# Patient Record
Sex: Male | Born: 1965 | Race: Black or African American | Hispanic: No | Marital: Married | Smoking: Former smoker
Health system: Southern US, Community
[De-identification: ages and names within clinical notes are randomized; demographics above are authoritative.]

## PROBLEM LIST (undated history)

## (undated) DIAGNOSIS — T7840XA Allergy, unspecified, initial encounter: Secondary | ICD-10-CM

## (undated) DIAGNOSIS — K219 Gastro-esophageal reflux disease without esophagitis: Secondary | ICD-10-CM

## (undated) DIAGNOSIS — M1990 Unspecified osteoarthritis, unspecified site: Secondary | ICD-10-CM

## (undated) DIAGNOSIS — Z8719 Personal history of other diseases of the digestive system: Secondary | ICD-10-CM

## (undated) DIAGNOSIS — I4819 Other persistent atrial fibrillation: Secondary | ICD-10-CM

## (undated) DIAGNOSIS — I499 Cardiac arrhythmia, unspecified: Secondary | ICD-10-CM

## (undated) DIAGNOSIS — E785 Hyperlipidemia, unspecified: Secondary | ICD-10-CM

## (undated) DIAGNOSIS — T4145XA Adverse effect of unspecified anesthetic, initial encounter: Secondary | ICD-10-CM

## (undated) DIAGNOSIS — G4733 Obstructive sleep apnea (adult) (pediatric): Secondary | ICD-10-CM

## (undated) DIAGNOSIS — I480 Paroxysmal atrial fibrillation: Secondary | ICD-10-CM

## (undated) DIAGNOSIS — I4891 Unspecified atrial fibrillation: Secondary | ICD-10-CM

## (undated) HISTORY — DX: Unspecified atrial fibrillation: I48.91

## (undated) HISTORY — DX: Other persistent atrial fibrillation: I48.19

## (undated) HISTORY — DX: Hyperlipidemia, unspecified: E78.5

## (undated) HISTORY — DX: Unspecified osteoarthritis, unspecified site: M19.90

## (undated) HISTORY — DX: Obstructive sleep apnea (adult) (pediatric): G47.33

## (undated) HISTORY — PX: ANTERIOR CRUCIATE LIGAMENT REPAIR: SHX115

## (undated) HISTORY — DX: Gastro-esophageal reflux disease without esophagitis: K21.9

## (undated) HISTORY — DX: Allergy, unspecified, initial encounter: T78.40XA

## (undated) HISTORY — DX: Paroxysmal atrial fibrillation: I48.0

## (undated) HISTORY — DX: Personal history of other diseases of the digestive system: Z87.19

---

## 1998-09-10 ENCOUNTER — Ambulatory Visit (HOSPITAL_COMMUNITY): Admission: RE | Admit: 1998-09-10 | Discharge: 1998-09-10 | Payer: Self-pay | Admitting: *Deleted

## 2006-03-14 ENCOUNTER — Ambulatory Visit: Payer: Self-pay | Admitting: Family Medicine

## 2006-03-16 ENCOUNTER — Ambulatory Visit: Payer: Self-pay | Admitting: Family Medicine

## 2006-06-20 ENCOUNTER — Ambulatory Visit: Payer: Self-pay | Admitting: Family Medicine

## 2006-11-08 ENCOUNTER — Ambulatory Visit: Payer: Self-pay | Admitting: Family Medicine

## 2007-03-08 ENCOUNTER — Ambulatory Visit: Payer: Self-pay | Admitting: Family Medicine

## 2007-09-20 ENCOUNTER — Ambulatory Visit: Payer: Self-pay | Admitting: Family Medicine

## 2008-02-22 ENCOUNTER — Observation Stay (HOSPITAL_COMMUNITY): Admission: EM | Admit: 2008-02-22 | Discharge: 2008-02-23 | Payer: Self-pay | Admitting: Emergency Medicine

## 2008-02-22 ENCOUNTER — Encounter (INDEPENDENT_AMBULATORY_CARE_PROVIDER_SITE_OTHER): Payer: Self-pay | Admitting: Internal Medicine

## 2008-02-22 ENCOUNTER — Ambulatory Visit: Payer: Self-pay | Admitting: Cardiology

## 2008-03-16 ENCOUNTER — Ambulatory Visit: Payer: Self-pay | Admitting: Family Medicine

## 2008-04-10 HISTORY — PX: TOTAL KNEE ARTHROPLASTY: SHX125

## 2008-04-10 HISTORY — PX: CHOLECYSTECTOMY: SHX55

## 2008-05-15 ENCOUNTER — Ambulatory Visit: Payer: Self-pay | Admitting: Cardiology

## 2008-05-22 ENCOUNTER — Ambulatory Visit: Payer: Self-pay

## 2008-05-22 ENCOUNTER — Encounter: Payer: Self-pay | Admitting: Cardiology

## 2008-06-01 ENCOUNTER — Ambulatory Visit: Payer: Self-pay | Admitting: Cardiology

## 2008-06-09 DIAGNOSIS — I4891 Unspecified atrial fibrillation: Secondary | ICD-10-CM | POA: Insufficient documentation

## 2008-07-20 ENCOUNTER — Ambulatory Visit: Payer: Self-pay | Admitting: Family Medicine

## 2008-10-30 ENCOUNTER — Encounter (INDEPENDENT_AMBULATORY_CARE_PROVIDER_SITE_OTHER): Payer: Self-pay | Admitting: *Deleted

## 2008-12-11 ENCOUNTER — Encounter (INDEPENDENT_AMBULATORY_CARE_PROVIDER_SITE_OTHER): Payer: Self-pay | Admitting: *Deleted

## 2008-12-16 ENCOUNTER — Ambulatory Visit: Payer: Self-pay | Admitting: Cardiology

## 2009-03-16 ENCOUNTER — Ambulatory Visit: Payer: Self-pay | Admitting: Family Medicine

## 2009-03-25 ENCOUNTER — Inpatient Hospital Stay (HOSPITAL_COMMUNITY): Admission: RE | Admit: 2009-03-25 | Discharge: 2009-03-28 | Payer: Self-pay | Admitting: Orthopedic Surgery

## 2009-12-28 IMAGING — CR DG CHEST 2V
2 series · 2 of 2 positions shown · non-contrast
Comparison: None.

CLINICAL DATA: Chest pain.

CHEST - 2 VIEW

[w chest pa]
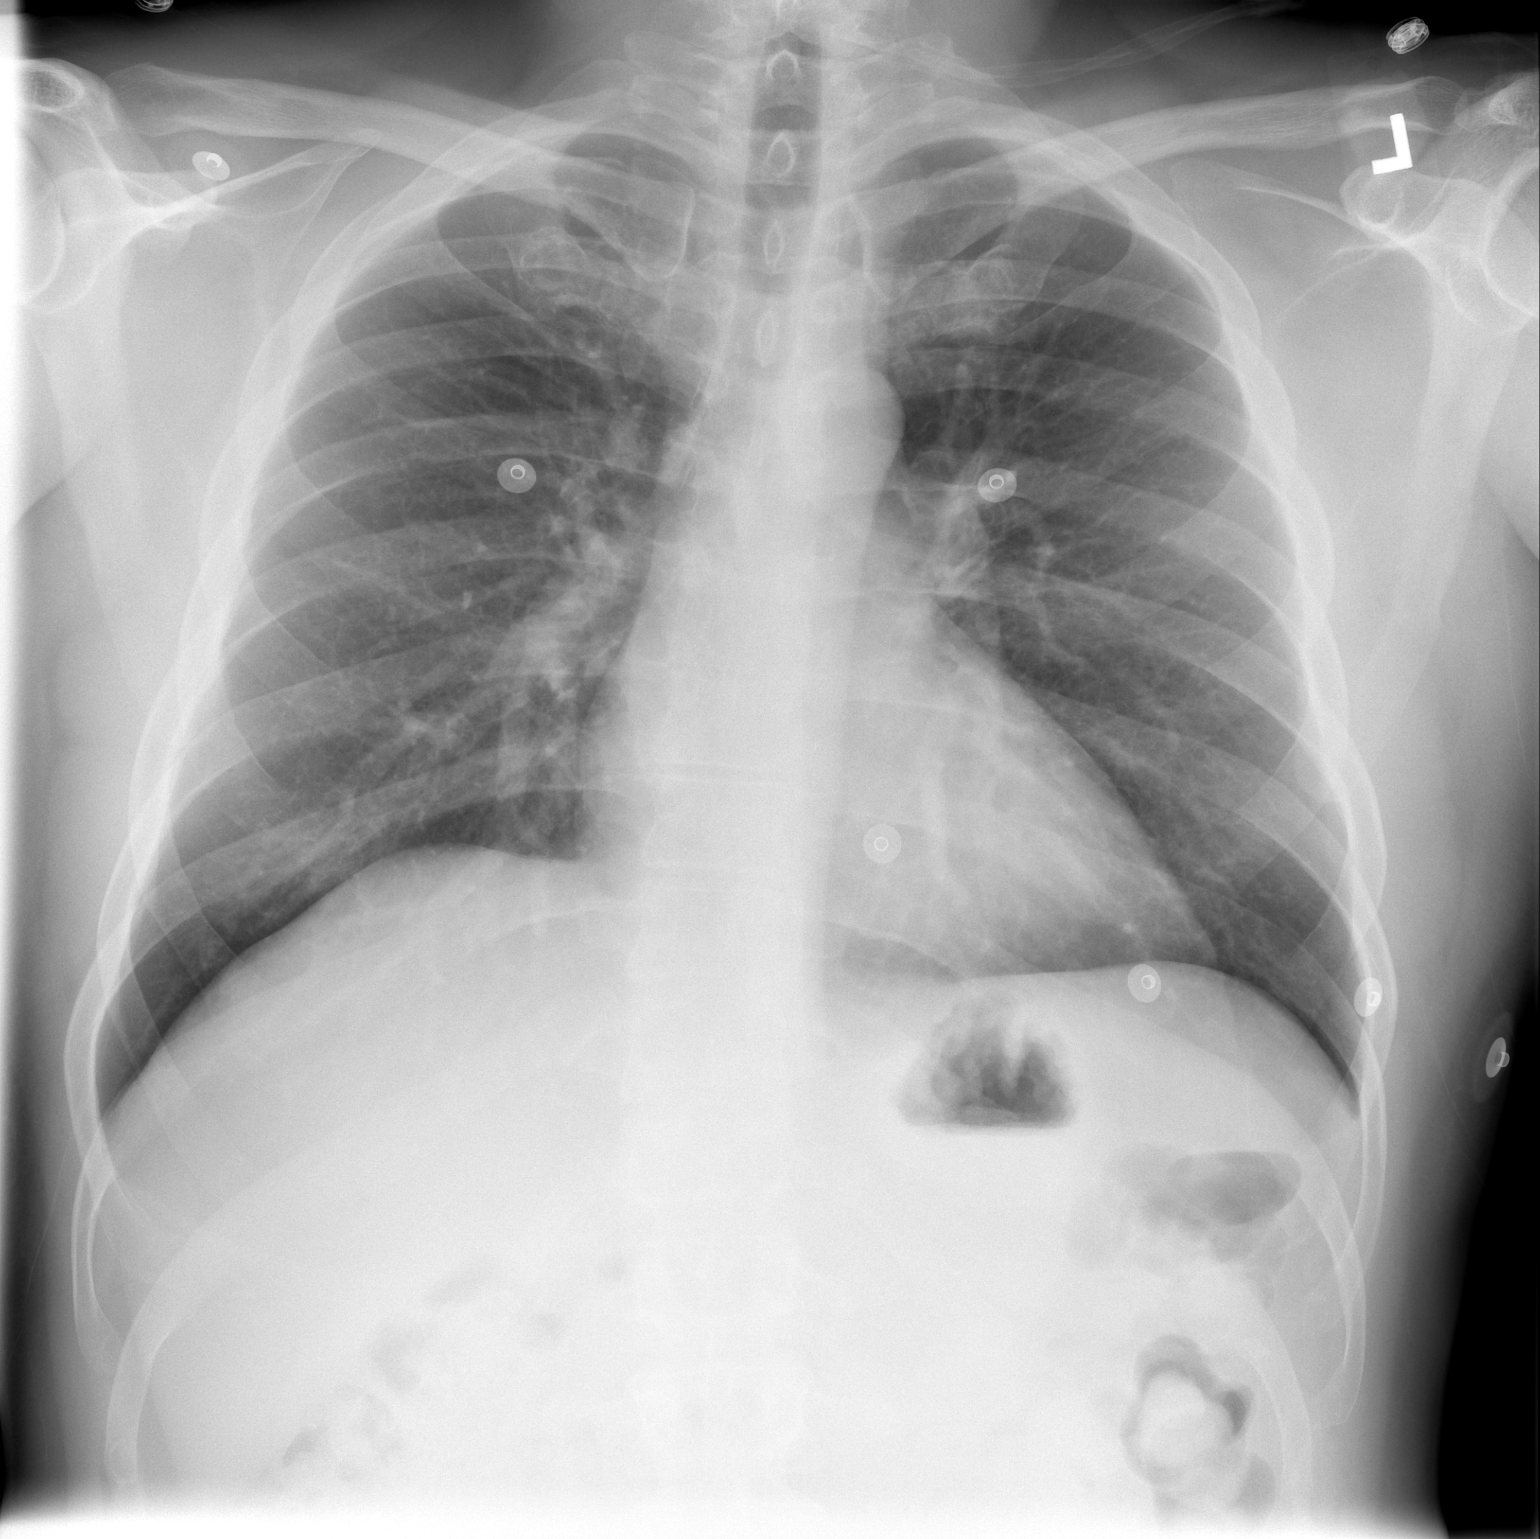

[w chest lat]
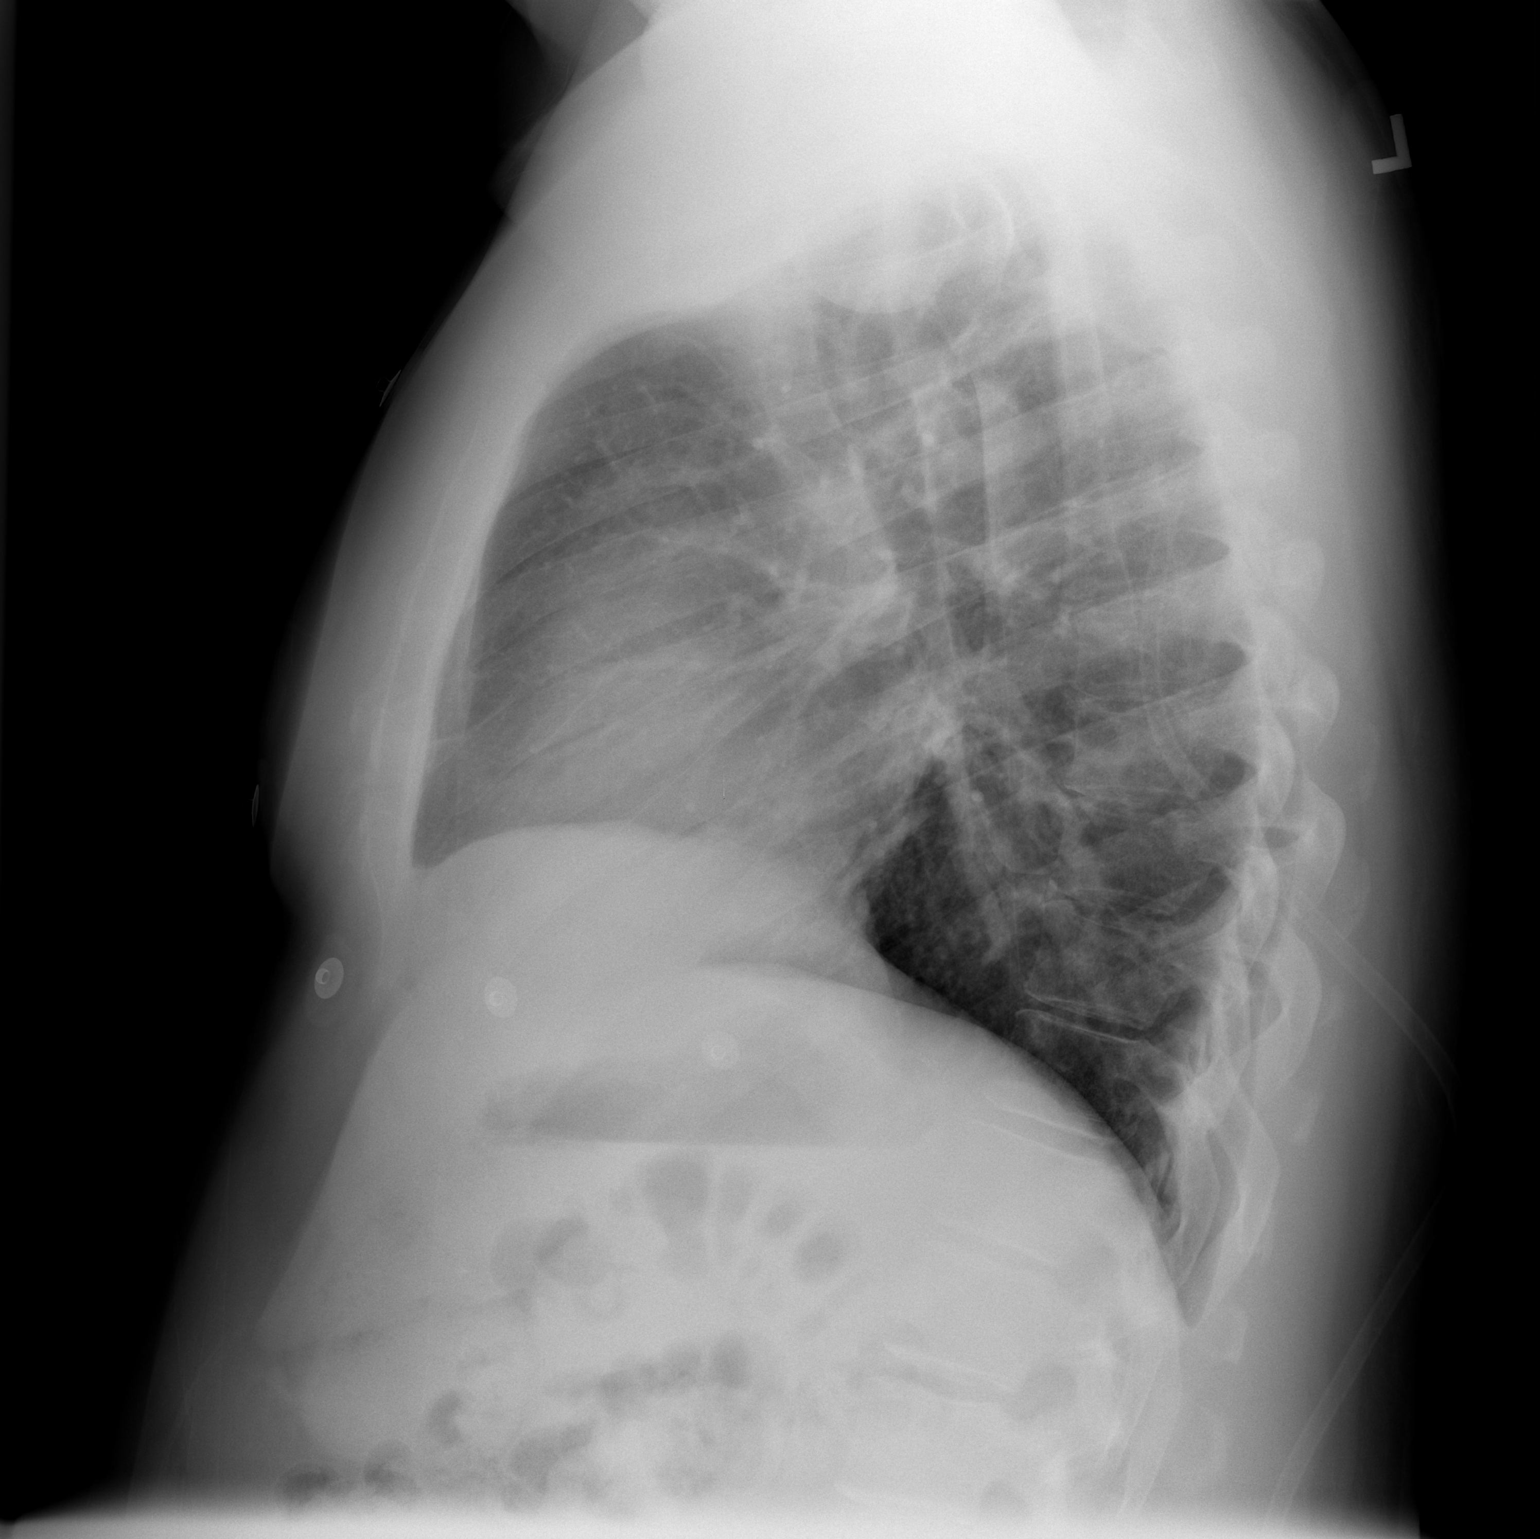

[2 of 2 positions shown; findings below may reference images not displayed]

FINDINGS: The lungs are clear without focal consolidation, edema,
effusion or pneumothorax.  Cardiopericardial silhouette is within
normal limits for size.  Imaged bony structures of the thorax are
intact.
IMPRESSION: No acute cardiopulmonary process.

## 2009-12-28 IMAGING — US US ABDOMEN COMPLETE
1 series · 14 of 25 positions shown · non-contrast
Comparison: None.

CLINICAL DATA: Chest pain.

ABDOMEN ULTRASOUND
TECHNIQUE: Complete abdominal ultrasound examination was performed
including evaluation of the liver, gallbladder, bile ducts,
pancreas, kidneys, spleen, IVC, and abdominal aorta.

[Series 1: unknown · 0.41mm/px · 14 of 69 slices shown]
[im 1/69]
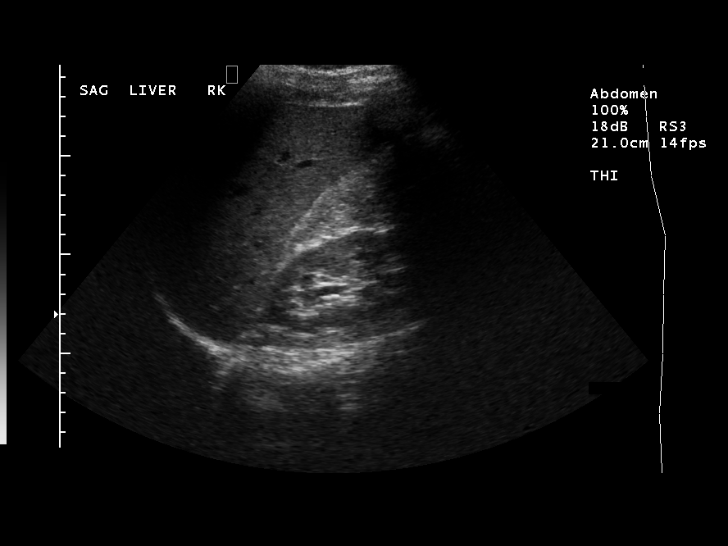
[im 6/69]
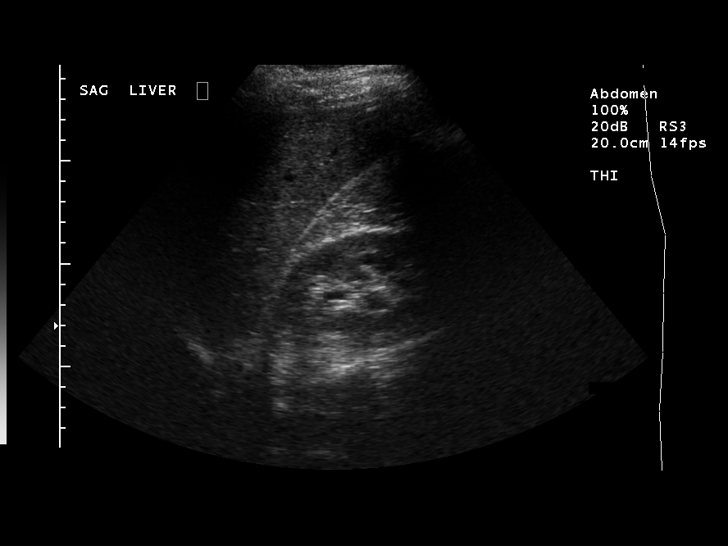
[im 12/69]
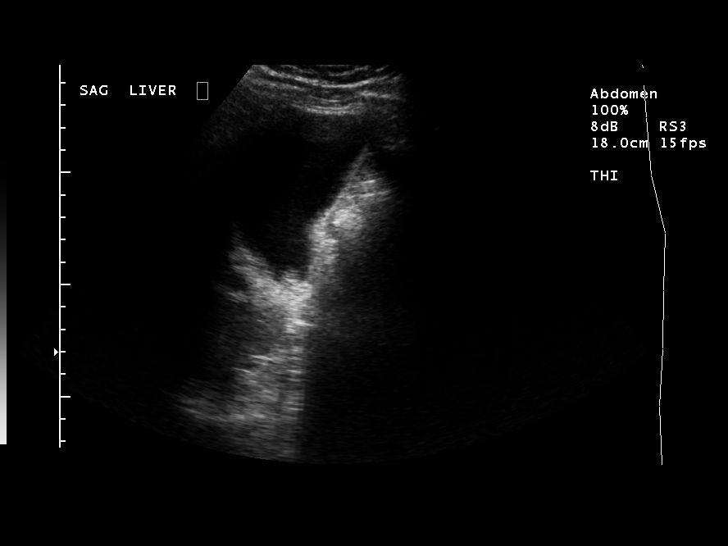
[im 18/69]
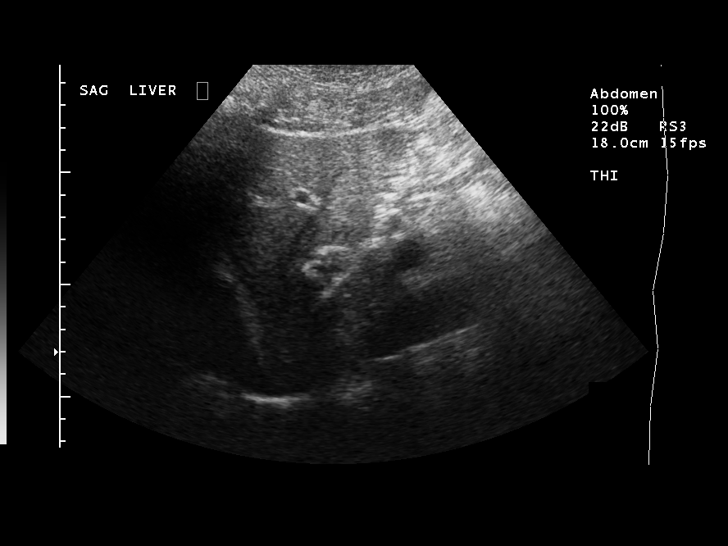
[im 23/69]
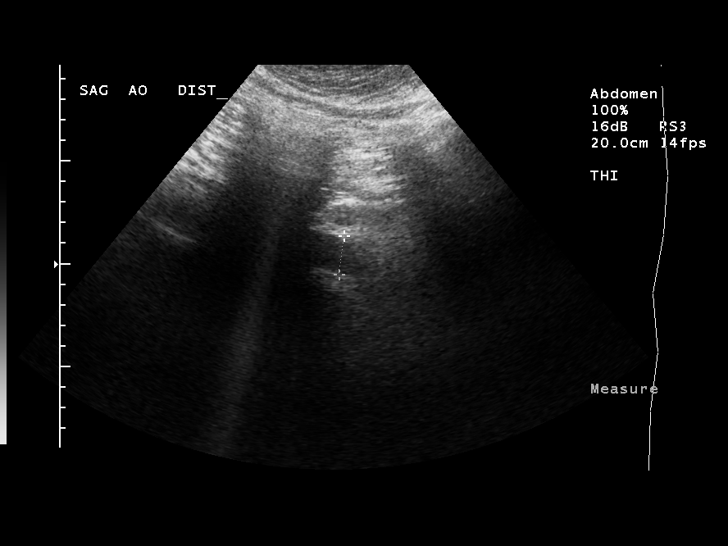
[im 26/69]
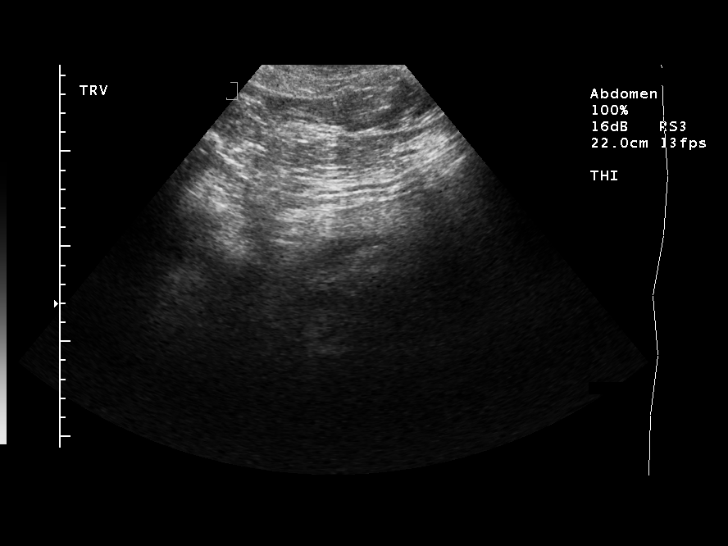
[im 32/69]
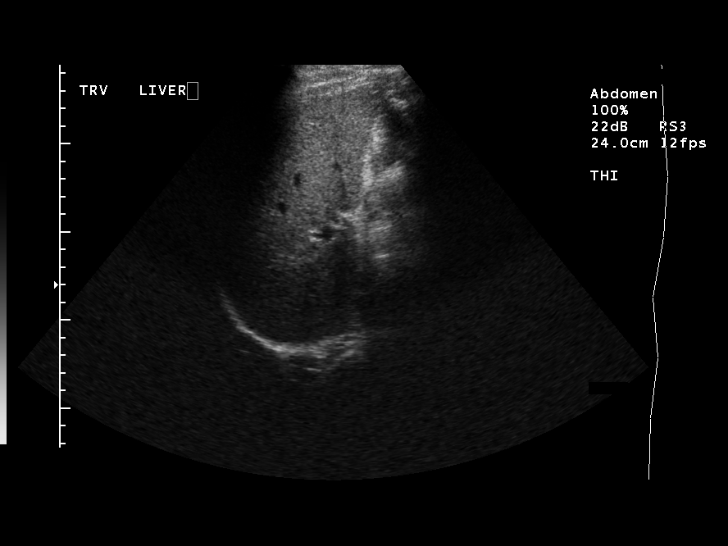
[im 37/69]
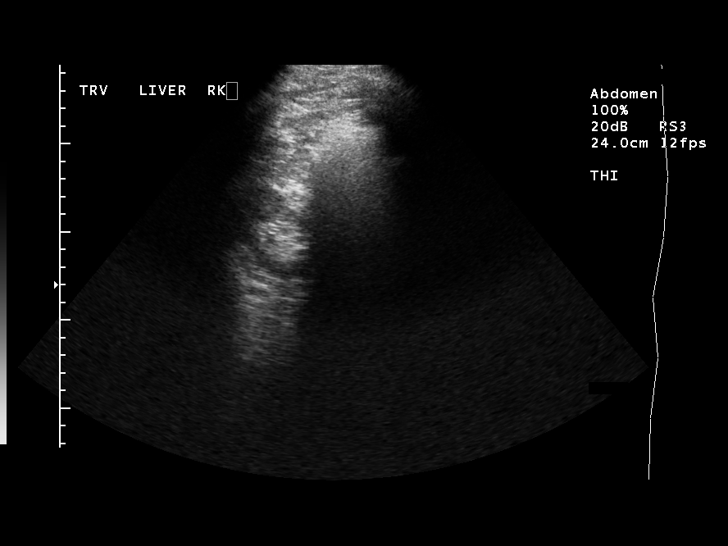
[im 43/69]
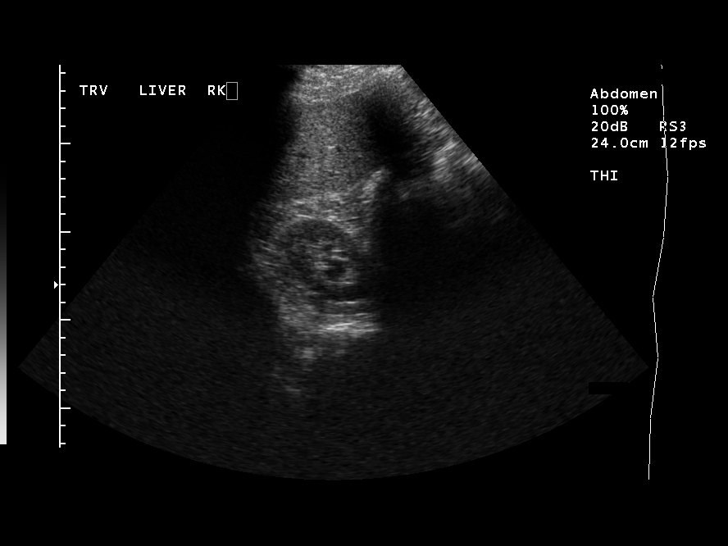
[im 46/69]
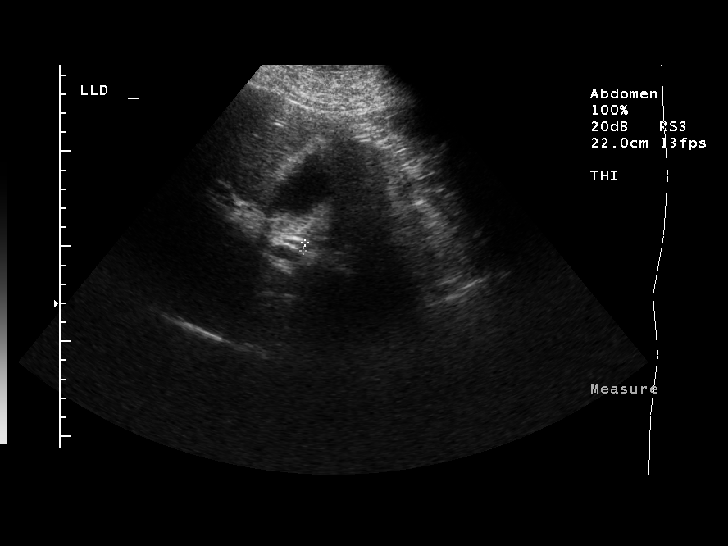
[im 52/69]
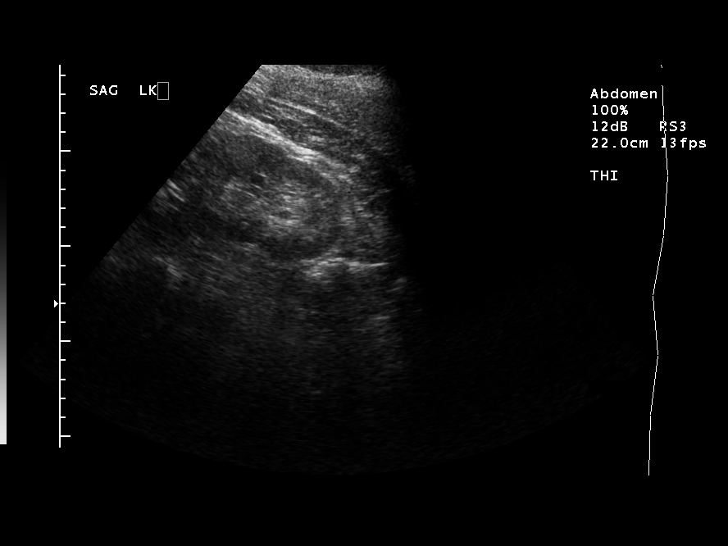
[im 57/69]
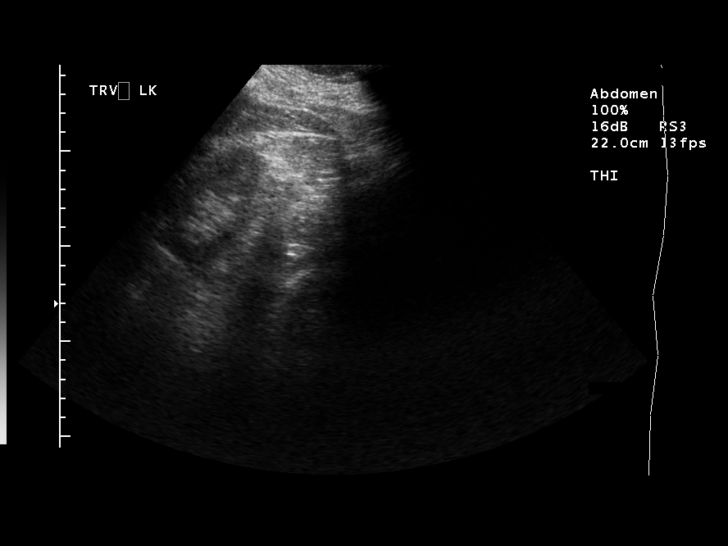
[im 63/69]
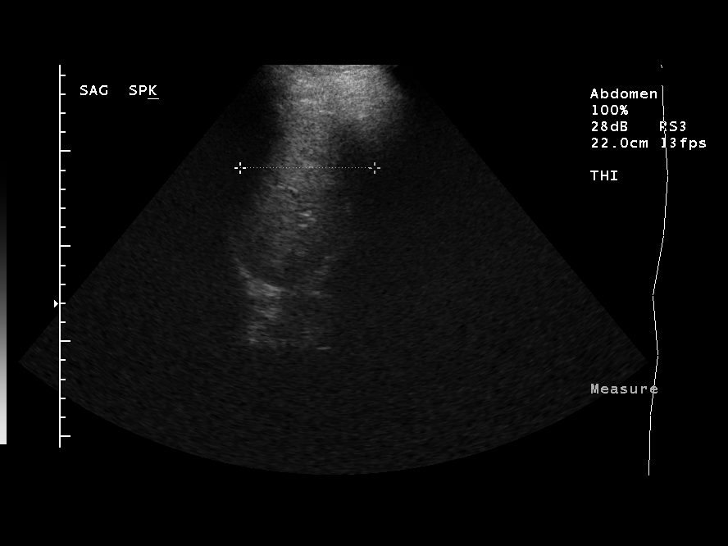
[im 69/69]
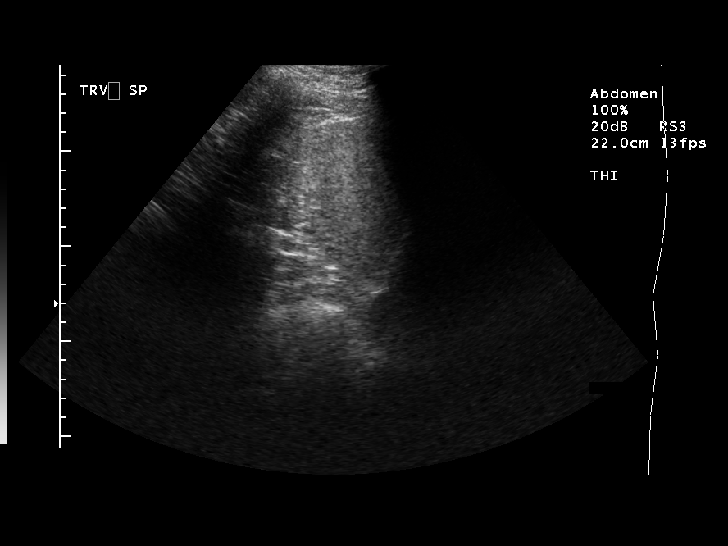

[14 of 25 positions shown; findings below may reference images not displayed]

FINDINGS: Multiple small gallstones in the gallbladder.  The
largest measures 9 mm in maximum diameter.  No gallbladder wall
thickening or pericholecystic fluid.  No tenderness over the
gallbladder.  No biliary ductal dilatation.  The proximal common
duct measures 4.4 mm in maximum diameter.  Poorly visualized
pancreas, abdominal aorta and inferior vena cava due to overlying
bowel gas.  The visualized portions appear normal.  Unremarkable
spleen, kidneys and liver.
IMPRESSION: 1.  Cholelithiasis without evidence of cholecystitis.
2.  Limited examination due to overlying bowel gas.

## 2010-02-09 ENCOUNTER — Ambulatory Visit: Payer: Self-pay | Admitting: Family Medicine

## 2010-05-10 NOTE — Letter (Signed)
Summary: Appointment - Reminder 2  [facility], Main Office  [address]   [place], [place] [address]   Phone: [phone]  Fax: [phone]     October 30, 2008 MRN: [id number]   Ying Ney [address] [place], [place]  [address]   Dear Mr. Milholland,  Our records indicate that it is time to schedule a follow-up appointment.  Dr.Wall recommended that you follow up with [place] in August 2010. It is very important that we reach you to schedule this appointment. We look forward to participating in your health care needs. Please contact us at the number listed above at your earliest convenience to schedule your appointment.  If you are unable to make an appointment at this time, give [place] a call so we can update our records.     Sincerely,  Lorne Skeens [facility] Scheduling Team

## 2010-05-10 NOTE — Miscellaneous (Signed)
Summary: med update  Clinical Lists Changes  Medications: Added new medication of SIMVASTATIN 40 MG TABS (SIMVASTATIN) 1 tab at bedtime Added new medication of ACIPHEX 20 MG TBEC (RABEPRAZOLE SODIUM) 1 tab once daily Added new medication of ASPIRIN EC 325 MG TBEC (ASPIRIN) Take one tablet by mouth daily Added new medication of MULTIVITAMINS   TABS (MULTIPLE VITAMIN) 1 tab once daily Added new medication of DILTIAZEM HCL ER BEADS 240 MG XR24H-CAP (DILTIAZEM HCL ER BEADS) 1 cap once daily

## 2010-05-10 NOTE — Assessment & Plan Note (Signed)
Summary: 6 MO F/U  Medications Added SIMVASTATIN 40 MG TABS (SIMVASTATIN) 1 tab at bedtime DILTIAZEM HCL ER BEADS 240 MG XR24H-CAP (DILTIAZEM HCL ER BEADS) 1 cap once daily      Allergies Added: NKDA  Visit Type:  6 mo f/u Primary Provider:  Dr. Susann Givens  CC:  no cardiac complaints.  History of Present Illness: Mr. Alan Thompson returns today for paroxysmal atrial fibrillation which is felt to be idiopathic.  He's had no breakthrough since I last saw him. He has gained weight and says he needs to get back into a regular exercise routine today. We reviewed this and the guidelines at length.  Current Medications (verified): 1)  Simvastatin 40 Mg Tabs (Simvastatin) .Marland Kitchen.. 1 Tab At Bedtime 2)  Aciphex 20 Mg Tbec (Rabeprazole Sodium) .Marland Kitchen.. 1 Tab Once Daily 3)  Aspirin Ec 325 Mg Tbec (Aspirin) .... Take One Tablet By Mouth Daily 4)  Multivitamins   Tabs (Multiple Vitamin) .Marland Kitchen.. 1 Tab Once Daily 5)  Diltiazem Hcl Er Beads 240 Mg Xr24h-Cap (Diltiazem Hcl Er Beads) .Marland Kitchen.. 1 Cap Once Daily  Allergies (verified): No Known Drug Allergies  Past History:  Past Medical History: Last updated: 06/09/2008 Atrial Fibrillation Hyperlipidemia G E R D  Past Surgical History: Last updated: 06/09/2008 Unremarkable  Family History: Last updated: 06/09/2008 Negative FH of Diabetes, Hypertension, or Coronary Artery Disease  Social History: Last updated: 06/09/2008 Full Time Married  Tobacco Use - Former.  Alcohol Use - no Regular Exercise - no Drug Use - no  Risk Factors: Exercise: no (06/09/2008)  Risk Factors: Smoking Status: quit (06/09/2008)  Review of Systems       negative other than history of present illness  Vital Signs:  Patient profile:   45 year old male Height:      70 inches Weight:      230 pounds BMI:     33.12 Pulse rate:   58 / minute Pulse rhythm:   regular BP sitting:   110 / 80  (left arm) Cuff size:   large  Vitals Entered By: Danielle [place], CMA (December 16, 2008 3:11 PM)  Physical Exam  General:  overweight Head:  normocephalic and atraumatic Eyes:  PERRLA/EOM intact; conjunctiva and lids normal. Mouth:  Teeth, gums and palate normal. Oral mucosa normal. Neck:  Neck supple, no JVD. No masses, thyromegaly or abnormal cervical nodes. Lungs:  Clear bilaterally to auscultation and percussion. Heart:  Non-displaced PMI, chest non-tender; regular rate and rhythm, S1, S2 without murmurs, rubs or gallops. Carotid upstroke normal, no bruit. Normal abdominal aortic size, no bruits. Femorals normal pulses, no bruits. Pedals normal pulses. No edema, no varicosities. Abdomen:  Bowel sounds positive; abdomen soft and non-tender without masses, organomegaly, or hernias noted. No hepatosplenomegaly. Msk:  Back normal, normal gait. Muscle strength and tone normal. Neurologic:  Alert and oriented x 3. Skin:  Intact without lesions or rashes. Psych:  Normal affect.   EKG  Procedure date:  12/16/2008  Findings:      sinus bradycardia, otherwise normal  Impression & Recommendations:  Problem # 1:  ATRIAL FIBRILLATION (ICD-427.31)  His updated medication list for this problem includes:    Aspirin Ec 325 Mg Tbec (Aspirin) .Marland Kitchen... Take one tablet by mouth daily Prescriptions: DILTIAZEM HCL ER BEADS 240 MG XR24H-CAP (DILTIAZEM HCL ER BEADS) 1 cap once daily  #90 x 3   Entered by:   Danielle [place], CMA   Authorized by:   Gaylord Shih, MD, [facility]  Signed by:   Danielle [place], CMA on 12/16/2008   Method used:   Electronically to        [facility]* (mail-order)             ,          Ph: [phone]       Fax: [phone]   RxID:   [id number] SIMVASTATIN 40 MG TABS (SIMVASTATIN) 1 tab at bedtime  #90 x 3   Entered by:   Danielle [place], CMA   Authorized by:   Gaylord Shih, MD, [facility]   Signed by:   Danielle [place], CMA on 12/16/2008   Method used:   Electronically to        [facility]* (mail-order)             ,          Ph: [phone]       Fax:  [phone]   RxID:   [id number]

## 2010-07-11 LAB — BASIC METABOLIC PANEL
BUN: 10 mg/dL (ref 6–23)
BUN: 10 mg/dL (ref 6–23)
BUN: 14 mg/dL (ref 6–23)
CO2: 27 mEq/L (ref 19–32)
CO2: 29 mEq/L (ref 19–32)
CO2: 29 mEq/L (ref 19–32)
Calcium: 8 mg/dL — ABNORMAL LOW (ref 8.4–10.5)
Calcium: 8.1 mg/dL — ABNORMAL LOW (ref 8.4–10.5)
Calcium: 8.1 mg/dL — ABNORMAL LOW (ref 8.4–10.5)
Chloride: 96 mEq/L (ref 96–112)
Chloride: 97 mEq/L (ref 96–112)
Chloride: 98 mEq/L (ref 96–112)
Creatinine, Ser: 1.18 mg/dL (ref 0.4–1.5)
Creatinine, Ser: 1.19 mg/dL (ref 0.4–1.5)
Creatinine, Ser: 1.23 mg/dL (ref 0.4–1.5)
GFR calc Af Amer: >60 mL/min (ref >60)
GFR calc Af Amer: >60 mL/min (ref >60)
GFR calc Af Amer: >60 mL/min (ref >60)
GFR calc non Af Amer: >60 mL/min (ref >60)
GFR calc non Af Amer: >60 mL/min (ref >60)
GFR calc non Af Amer: >60 mL/min (ref >60)
Glucose, Bld: 111 mg/dL — ABNORMAL HIGH (ref 70–99)
Glucose, Bld: 112 mg/dL — ABNORMAL HIGH (ref 70–99)
Glucose, Bld: 125 mg/dL — ABNORMAL HIGH (ref 70–99)
Potassium: 3.7 mEq/L (ref 3.5–5.1)
Potassium: 4.2 mEq/L (ref 3.5–5.1)
Potassium: 4.4 mEq/L (ref 3.5–5.1)
Sodium: 132 mEq/L — ABNORMAL LOW (ref 135–145)
Sodium: 132 mEq/L — ABNORMAL LOW (ref 135–145)
Sodium: 133 mEq/L — ABNORMAL LOW (ref 135–145)

## 2010-07-11 LAB — CBC
HCT: 25.5 % — ABNORMAL LOW (ref 39.0–52.0)
HCT: 27.2 % — ABNORMAL LOW (ref 39.0–52.0)
HCT: 33.7 % — ABNORMAL LOW (ref 39.0–52.0)
Hemoglobin: 11.6 g/dL — ABNORMAL LOW (ref 13.0–17.0)
Hemoglobin: 8.7 g/dL — ABNORMAL LOW (ref 13.0–17.0)
Hemoglobin: 9.6 g/dL — ABNORMAL LOW (ref 13.0–17.0)
MCHC: 34.1 g/dL (ref 30.0–36.0)
MCHC: 34.3 g/dL (ref 30.0–36.0)
MCHC: 35.2 g/dL (ref 30.0–36.0)
MCV: 93.5 fL (ref 78.0–100.0)
MCV: 94.3 fL (ref 78.0–100.0)
MCV: 94.9 fL (ref 78.0–100.0)
Platelets: 167 10*3/uL (ref 150–400)
Platelets: 183 10*3/uL (ref 150–400)
Platelets: 213 10*3/uL (ref 150–400)
RBC: 2.7 MIL/uL — ABNORMAL LOW (ref 4.22–5.81)
RBC: 2.91 MIL/uL — ABNORMAL LOW (ref 4.22–5.81)
RBC: 3.55 MIL/uL — ABNORMAL LOW (ref 4.22–5.81)
RDW: 12.3 % (ref 11.5–15.5)
RDW: 12.3 % (ref 11.5–15.5)
RDW: 12.4 % (ref 11.5–15.5)
WBC: 10.3 10*3/uL (ref 4.0–10.5)
WBC: 9.3 10*3/uL (ref 4.0–10.5)
WBC: 9.4 10*3/uL (ref 4.0–10.5)

## 2010-07-11 LAB — PROTIME-INR
INR: 1.3 (ref 0.00–1.49)
INR: 1.56 — ABNORMAL HIGH (ref 0.00–1.49)
INR: 1.95 — ABNORMAL HIGH (ref 0.00–1.49)
Prothrombin Time: 16.1 seconds — ABNORMAL HIGH (ref 11.6–15.2)
Prothrombin Time: 18.5 seconds — ABNORMAL HIGH (ref 11.6–15.2)
Prothrombin Time: 22.1 seconds — ABNORMAL HIGH (ref 11.6–15.2)

## 2010-07-12 LAB — COMPREHENSIVE METABOLIC PANEL
ALT: 30 U/L (ref 0–53)
AST: 28 U/L (ref 0–37)
Albumin: 4.2 g/dL (ref 3.5–5.2)
Alkaline Phosphatase: 55 U/L (ref 39–117)
BUN: 18 mg/dL (ref 6–23)
CO2: 28 mEq/L (ref 19–32)
Calcium: 9.2 mg/dL (ref 8.4–10.5)
Chloride: 102 mEq/L (ref 96–112)
Creatinine, Ser: 1.28 mg/dL (ref 0.4–1.5)
GFR calc Af Amer: >60 mL/min (ref >60)
GFR calc non Af Amer: >60 mL/min (ref >60)
Glucose, Bld: 106 mg/dL — ABNORMAL HIGH (ref 70–99)
Potassium: 4 mEq/L (ref 3.5–5.1)
Sodium: 137 mEq/L (ref 135–145)
Total Bilirubin: 1.6 mg/dL — ABNORMAL HIGH (ref 0.3–1.2)
Total Protein: 7.4 g/dL (ref 6.0–8.3)

## 2010-07-12 LAB — CBC
HCT: 45.3 % (ref 39.0–52.0)
Hemoglobin: 15.5 g/dL (ref 13.0–17.0)
MCHC: 34.3 g/dL (ref 30.0–36.0)
MCV: 94.3 fL (ref 78.0–100.0)
Platelets: 231 10*3/uL (ref 150–400)
RBC: 4.8 MIL/uL (ref 4.22–5.81)
RDW: 12.1 % (ref 11.5–15.5)
WBC: 5.9 10*3/uL (ref 4.0–10.5)

## 2010-07-12 LAB — PROTIME-INR
INR: 1.17 (ref 0.00–1.49)
Prothrombin Time: 14.8 seconds (ref 11.6–15.2)

## 2010-07-12 LAB — APTT: aPTT: 28 seconds (ref 24–37)

## 2010-08-17 ENCOUNTER — Encounter: Payer: Self-pay | Admitting: Cardiology

## 2010-08-19 ENCOUNTER — Ambulatory Visit (INDEPENDENT_AMBULATORY_CARE_PROVIDER_SITE_OTHER): Payer: 59 | Admitting: Cardiology

## 2010-08-19 ENCOUNTER — Encounter: Payer: Self-pay | Admitting: Cardiology

## 2010-08-19 VITALS — BP 120/80 | HR 114 | Resp 14 | Ht 70.0 in | Wt 239.0 lb

## 2010-08-19 DIAGNOSIS — I4891 Unspecified atrial fibrillation: Secondary | ICD-10-CM

## 2010-08-19 MED ORDER — DILTIAZEM HCL ER COATED BEADS 360 MG PO CP24: 360.0000 mg | ORAL_CAPSULE | Freq: Every day | ORAL | Status: DC

## 2010-08-19 NOTE — Assessment & Plan Note (Signed)
Rate is not well controlled. Will increase his diltiazem to 360/day and arrange GXT to confirm rate control. Also will obtain an Echo to reassess Lv function.

## 2010-08-19 NOTE — Progress Notes (Signed)
   Patient ID: Alan Thompson, male    DOB: 1965-11-20, 45 y.o.   MRN: [id number]  HPI  Mr Alan Thompson returns today for E and M of his idiopathic PAF. He is totally asymptomatic. He wants to begin a regular workout [place] and try and lose some weight.  EKG today shows AF with a heart rate of 114/min.   Review of Systems  All other systems reviewed and are negative.      Physical Exam  Nursing note and vitals reviewed. Constitutional: He is oriented to person, place, and time. He appears well-developed and well-nourished. No distress.       obese  HENT:  Head: Normocephalic and atraumatic.  Eyes: EOM are normal. Pupils are equal, round, and reactive to light.  Neck: Neck supple. No JVD present. No tracheal deviation present. No thyromegaly present.  Cardiovascular: Normal heart sounds and normal pulses.  An irregular rhythm present.  No extrasystoles are present. Tachycardia present.  PMI is not displaced.  Exam reveals no S3.     No systolic murmur is present   No diastolic murmur is present  Pulmonary/Chest: Effort normal and breath sounds normal.  Abdominal: Soft. Bowel sounds are normal. He exhibits no distension. There is no tenderness.  Musculoskeletal: Normal range of motion. He exhibits no edema.  Neurological: He is alert and oriented to person, place, and time.  Skin: Skin is warm.  Psychiatric: He has a normal mood and affect.

## 2010-08-19 NOTE — Patient Instructions (Signed)
Your physician has requested that you have an echocardiogram. Echocardiography is a painless test that uses sound waves to create images of your heart. It provides your doctor with information about the size and shape of your heart and how well your heart's chambers and valves are working. This procedure takes approximately one hour. There are no restrictions for this procedure.   Your physician has requested that you have an exercise tolerance test. For further information please visit [email or web address] Please also follow instruction sheet, as given.- Treadmill Make sure to take your Diltiazem that morning.  Your physician has recommended you make the following change in your medication:  Diltiazem CD 360mg  daily Your physician discussed the importance of regular exercise and recommended that you start or continue a regular exercise program for good health. When working out keep heart rate between 135-150 for optimum cardiac workout.

## 2010-08-23 NOTE — Assessment & Plan Note (Signed)
[facility]                            CARDIOLOGY OFFICE NOTE   NAME:Thompson, Alan F                       MRN:          [id number]  DATE:05/15/2008                            DOB:          18-Jan-1966    Alan Thompson comes in today for followup.  He was in the hospital back in  November with paroxysmal atrial fibrillation.  This is felt to be lone  or idiopathic with no underlying risk factors.  Specifically, he has no  history of hypertension, diabetes, cardiac disease, history of CVA,  history of stroke, and history of valvular disease.   He comes to the office today, and he is in atrial fib, rate 140.  He is  totally asymptomatic.   His predisposing factors are probably excess caffeine.  He likes to have  2 venti a day.  He also came in late last night from a trip in  [place] and probably had three to four glasses of wine.   He had a stress Myoview which showed EF of 62% with no ischemia.  He has  not had a 2-D echocardiogram.  Chest x-ray did show a normal-sized  heart.   Laboratory data has revealed normal CBC.  He did have a borderline  potassium of 3.5.  His cardiac enzymes were negative.  Total cholesterol  is 173, triglycerides of 241, HDL 39, LDL of 86 on simvastatin and his  free T4 and TSH were normal.   MEDICATIONS:  1. Simvastatin 40 mg a day.  2. Aciphex 20 mg a day.  3. Aspirin 325 mg a day.  4. Centrum vitamin one daily.   ALLERGIES:  He has no known drug allergies.   PHYSICAL EXAMINATION:  VITAL SIGNS:  Blood pressure is 100/72, his pulse  is 140 and irregular.  Weight is 235.  GENERAL:  He is in no acute distress.  HEENT:  Normal.  NECK:  Carotid upstrokes are equal bilaterally without bruits.  Thyroid  is not enlarged.  Trachea is midline.  LUNGS:  Clear.  HEART:  Irregular rate and rhythm.  No gallop.  PMI is nondisplaced.  There is no murmur.  ABDOMEN:  Soft.  Good bowel sounds.  No midline bruit.  No  hepatomegaly.  EXTREMITIES:  No cyanosis, clubbing, or edema.  Pulses are intact.  NEUROLOGIC:  Intact.  SKIN:  Unremarkable.   EKG confirms atrial fib with no ST-segment changes.   ASSESSMENT:  1. Lone or idiopathic paroxysmal atrial fibrillation, asymptomatic,      with an increased ventricular rate.  2. Mixed hyperlipidemia.  3. Excess caffeine use.   PLAN:  1. Continue aspirin 325 mg a day.  2. Diltiazem extended release 240 mg per day.  This may be titrated up      if necessary.  3. A 2-D echocardiogram next week.  4. See me back in 2 weeks.   Hopefully, his rate will slow and he will convert back into sinus  rhythm.  He has before.     Alan C. Daleen Squibb, MD, [facility]  Electronically Signed    TCW/MedQ  DD: 05/15/2008  DT: 05/16/2008  Job #: [id number]   cc:   Alan Thompson, M.D.

## 2010-08-23 NOTE — Assessment & Plan Note (Signed)
[facility]                            CARDIOLOGY OFFICE NOTE   NAME:Sporrer, Bryler F                       MRN:          [id number]  DATE:06/01/2008                            DOB:          March 05, 1966    Mr. Pennella comes in today for followup of his paroxysmal atrial  fibrillation.  It is felt that this is idiopathic or lone.   Please see my previous note for details as well as the hospitalization  note.   I started on diltiazem extended release 240 mg a day.  He felt that he  went back in normal rhythm spontaneously today he left the office.  This  is before even took the medication.  He was not very symptomatic.   His 2-D echocardiograms completely normal except for his left atrium  being mildly dilated, which is to be expected when he is in atrial fib.  His left atrial dimension was only 41, however.  EF was 60% with no  regional wall motion abnormalities.   He remains on aspirin 325 mg a day.   PHYSICAL EXAMINATION:  VITAL SIGNS:  His blood pressure today is 128/88,  his pulse is 63 and regular, his weight is down to 233.  HEENT:  Other than glasses is normal.  NECK:  Supple.  Carotid upstrokes were equal bilaterally without bruits.  No JVD.  Thyroid is not enlarged.  Trachea is midline.  LUNGS:  Clear to  auscultation and percussion.  HEART:  Nondisplaced PMI.  Normal S1 and S2.  No gallop.  ABDOMEN:  Soft.  Good bowel sounds.  No midline bruit.  No hepatomegaly.  EXTREMITIES:  No cyanosis, clubbing, or edema.  Pulses are intact.  NEURO:  Intact.   I had a long talk with Mr. Gloster today.  He will remain on tier 1  therapy with diltiazem and a course of aspirin.  I will see him back in  6 months.  I have taught him how to check his carotid pulse prior to  activity or exertion.  He will let me know if he has any asymptomatic  atrial fib.     Thomas C. Daleen Squibb, MD, [facility]  Electronically Signed    TCW/MedQ  DD: 06/01/2008  DT:  06/02/2008  Job #: [id number]   cc:   Sharlot Gowda, M.D.

## 2010-08-23 NOTE — Consult Note (Signed)
NAME:  Alan Thompson, Alan Thompson                ACCOUNT NO.:  [id number]   MEDICAL RECORD NO.:  [id number]          PATIENT TYPE:  INP   LOCATION:  1825                         FACILITY:  [facility]   PHYSICIAN:  Gerrit Friends. Dietrich Pates, MD, FACCDATE OF BIRTH:  08/05/1965   DATE OF CONSULTATION:  02/22/2008  DATE OF DISCHARGE:                                 CONSULTATION   REFERRING PHYSICIAN:  Dr. Janice Norrie of [facility], Team E.   CARDIOLOGIST:  He is new to Dr. Dietrich Pates.   PRIMARY CARE PHYSICIAN:  Dr. Susann Givens.   REASON FOR CONSULTATION:  1. Chest pain.  2. Atrial fibrillation.   HISTORY OF PRESENT ILLNESS:  Alan Thompson is a 45 year old male patient  with history of hyperlipidemia, controlled on simvastatin, who was in  his usual state of health until last night when he developed substernal  chest fullness.  He thought it was secondary to his GERD and went to  bed.  It awoke him around 3 o'clock in the morning.  He took [employer] without relief.  This would often relieve his substernal chest  pain from his acid reflux disease.  This concerned him and he called  EMS.  His wife noted that he was pale.  He denies diaphoresis.  He did  have slight nausea.  He did have some radiation to his back.  Denies any  arm or jaw pain.  Denies any shortness of breath or syncope.  In the  emergency room, he received nitroglycerin with prompt relief of his  symptoms.  He then had Nitropaste placed but this contributed to  considerable headache and was discontinued.  He was actually set up for  discharge to home with plans for outpatient followup.  However, the  patient developed paroxysmal atrial fibrillation with rapid ventricular  rate while in the emergency room.  He was therefore kept for further  observation.  We were asked to further evaluate.   PAST MEDICAL HISTORY:  1. Gastroesophageal reflux disease.  2. Hyperlipidemia.  3. Osteoarthritis status post multiple knee surgeries, as well as  bilateral total knee replacements.   MEDICATIONS AT HOME:  1. Aciphex 20 mg daily.  2. Simvastatin 40 mg q.h.s.   ALLERGIES:  NO KNOWN DRUG ALLERGIES.   SOCIAL HISTORY:  The patient lives in [place] with his wife.  He is a  Dealer.  He has a less than 10 pack-year history of smoking  and quit about 10 years ago.  He has 1 child.  Denies alcohol or drug  abuse.   FAMILY HISTORY:  Insignificant for premature CAD.  However, he has  multiple family members who have a history of atrial fibrillation.   REVIEW OF SYSTEMS:  Please see HPI.  Denies any fever, chills, cough,  melena, hematochezia, hematuria, or dysuria.  Rest of the review of  systems are negative.   PHYSICAL EXAM:  He is a well-nourished, well-developed man.  His blood  pressure is 116/82.  Pulse 67.  Respirations 20.  Temperature 98.4.  oxygen saturation 98% on room air.  HEENT:  Normal.  NECK:  Without  JVD.  LYMPH:  Without lymphadenopathy.  ENDOCRINE:  Without thyromegaly.  CARDIAC:  Normal S1 and S2.  Regular rate and rhythm without murmur.  LUNGS:  Clear to auscultation bilaterally without wheezes, rhonchi, or  rales.  ABDOMEN:  Soft, nontender with normoactive bowel sounds.  No  organomegaly.  EXTREMITIES:  Without clubbing, cyanosis, or edema.  MUSCULOSKELETAL:  Without joint deformity.  NEUROLOGIC:  He is alert and oriented x3.  Cranial nerves II-XII grossly  intact.  SKIN:  Warm and dry without rashes.  VASCULAR EXAM:  Without carotid bruits bilaterally.   Chest x-ray, no acute disease.  Abdominal ultrasound ordered in the  emergency room demonstrated cholelithiasis without cholecystitis.  EKG,  atrial fibrillation with a heart rate of 100, normal axis, no acute  changes.  Followup electrocardiogram revealing normal sinus rhythm.   LABS:  White count 8400, hemoglobin 13.3, hematocrit 39.4, platelet  count 219,000, sodium 138, potassium 3.8, BUN 17, creatinine 1.21,  glucose 95.  Point of  care markers negative x2.  CK 134, CK-MB 1.3,  troponin less than 0.01.   ASSESSMENT AND PLAN:  1. Chest pain.  His symptoms are somewhat atypical and could likely be      related to esophageal spasm especially since he had prompt relief      with nitroglycerin.  However, he also has evidence of paroxysmal      atrial fibrillation.  In light of chest pain with documented      arrhythmia, we think it is best that he be observed in the hospital      overnight with plans for followup stress testing tomorrow as an      inpatient.  He will undergo gated exercise treadmill Myoview study      in the morning.  He will continue on aspirin as already ordered.      Nitroglycerin can be used p.r.n. chest pain.  He will remain on DVT      prophylaxis heparin as already ordered.  2. Paroxysmal atrial fibrillation now normal sinus rhythm.  TSH will      be added to his labs.  He will also need an outpatient      echocardiogram.  This can be arranged at discharge.  3. Dyslipidemia.  He will continue on simvastatin.  4. Gastroesophageal reflux disease.  His proton pump inhibitor should      be continued.  5. Degenerative joint disease status post bilateral total knee      replacements.  The patient notes that he should be able to complete      the treadmill study without difficulty from his knees.   DISPOSITION:  The patient was also interviewed and examined by Dr.  Dietrich Pates.  As noted above, we will proceed with stress testing tomorrow.  Further recommendations to follow once we know the results of his stress  testing.      Tereso Newcomer, PA-C      Gerrit Friends. Dietrich Pates, MD, [facility]  Electronically Signed    SW/MEDQ  D:  02/22/2008  T:  02/22/2008  Job:  [id number]   cc:   Dr. Lissa Morales, M.D.

## 2010-08-23 NOTE — H&P (Signed)
NAME:  Alan Thompson, Alan Thompson                ACCOUNT NO.:  [id number]   MEDICAL RECORD NO.:  [id number]          PATIENT TYPE:  INP   LOCATION:  2012                         FACILITY:  [facility]   PHYSICIAN:  Renee Ramus, MD       DATE OF BIRTH:  July 13, 1965   DATE OF ADMISSION:  02/22/2008  DATE OF DISCHARGE:                              HISTORY & PHYSICAL   PRIMARY CARE PHYSICIAN:  __________   HISTORY OF PRESENT ILLNESS:  The patient is a 45 year old male who had  some feelings of being bloated last night.  Around 4 a.m. on the day of  admission, the patient had more feelings of feeling full and bloated  along with a sharp substernal chest pain without radiation.  The  patient had no relieving or exacerbating factors.  The patient also  experienced mild nausea.  He denies having previous history of these  symptoms.  The patient's pain was relieved with nitroglycerin, and he  did take Alka-Seltzer with no good effect.  The patient was seen in the  emergency department.  There, he has had 2 negative sets of enzymes as  well as normal EKG and x-ray.  The patient currently get abdominal  ultrasound.  The patient is thought to be stable for discharge with  stress test on Monday morning with [facility].  He has been  given contact information for [facility] and will follow up with them.   PAST MEDICAL HISTORY:  1. Hypercholesterolemia.  2. Gastroesophageal reflux disease.  3. History of hiatal hernia.   SOCIAL HISTORY:  No tobacco.  Occasional alcohol use.  The patient did  smoke approximately 10 years prior but has had none.  No tobacco use  recently.   FAMILY HISTORY:  Not available.   REVIEW OF SYSTEMS:  All other comprehensive review of systems are  negative.   MEDICATIONS CURRENTLY:  Simvastatin 40 mg p.o. daily.   ALLERGIES:  NKDA.   PHYSICAL EXAMINATION:  GENERAL:  He is a well-developed, well-nourished  white male currently in no apparent distress.  VITAL SIGNS:  Pulse 69,  respiratory rate 18, temperature 98.2, and blood  pressure 120/88.  HEENT:  No jugular venous distention or lymphadenopathy.  Oropharynx is  clear.  Mucous membranes pink and moist.  TMs are clear bilaterally.  Pupils equal and reactive to light and accommodation.  Extraocular  muscles are intact.  CARDIOVASCULAR:  He has a regular rate and rhythm without murmurs, rubs,  or gallops.  PULMONARY:  Lungs are clear to auscultation bilaterally.  ABDOMEN:  Soft, nontender, and nondistended without hepatosplenomegaly.  Bowel sounds are present.  He has no rebound or guarding.  EXTREMITIES:  No clubbing, cyanosis, or edema.  He has good peripheral  pulses in dorsalis pedis and radial artery.  He is able to move all  extremities.  NEUROLOGIC:  Cranial nerves II though XII are grossly intact.  He has no  focal neurological deficits.   STUDIES:  1. EKG shows normal sinus rhythm.  2. Chest x-ray shows no acute disease.   LABORATORY DATA:  Sodium 141, potassium 3.5,  chloride 105, bicarb 25,  BUN 12, creatinine 0.9, and glucose 112.  H and H 13 and 40.  D-dimer  0.43.  Troponin I less than 0.05 and less than 0.01.   ASSESSMENT AND PLAN:  Chest pain as previously dictated.  The patient  has a TIMI score 0-1; 1 point for aspirin use.  I do not believe this  represents acute coronary artery syndrome.  I have discussed with the  patient.  He is to follow up with [facility] and will return to the  emergency department if he experiences any more symptoms.  There are no  labs or studies pending with the exception of abdominal ultrasound prior  to discharge.  The patient is in stable condition and anxious for  discharge.  Time spent 45 minutes.      Renee Ramus, MD  Electronically Signed     JF/MEDQ  D:  02/22/2008  T:  02/22/2008  Job:  [id number]

## 2010-09-09 ENCOUNTER — Ambulatory Visit (INDEPENDENT_AMBULATORY_CARE_PROVIDER_SITE_OTHER): Payer: 59 | Admitting: Cardiology

## 2010-09-09 ENCOUNTER — Ambulatory Visit (HOSPITAL_COMMUNITY): Payer: 59 | Attending: Family Medicine | Admitting: Radiology

## 2010-09-09 DIAGNOSIS — I4891 Unspecified atrial fibrillation: Secondary | ICD-10-CM | POA: Insufficient documentation

## 2010-09-09 DIAGNOSIS — R000 Tachycardia, unspecified: Secondary | ICD-10-CM | POA: Insufficient documentation

## 2010-09-09 DIAGNOSIS — I079 Rheumatic tricuspid valve disease, unspecified: Secondary | ICD-10-CM | POA: Insufficient documentation

## 2010-09-09 DIAGNOSIS — I080 Rheumatic disorders of both mitral and aortic valves: Secondary | ICD-10-CM | POA: Insufficient documentation

## 2010-09-09 DIAGNOSIS — I379 Nonrheumatic pulmonary valve disorder, unspecified: Secondary | ICD-10-CM | POA: Insufficient documentation

## 2010-09-09 NOTE — Progress Notes (Signed)
Exercise Treadmill Test  Pre-Exercise Testing Evaluation Rhythm: normal sinus  Rate: 61   PR:  .18 QRS:  .08  QT:  .39 QTc: .39     Test  Exercise Tolerance Test Ordering MD: Valera Castle, MD  Interpreting MD:  Valera Castle, MD  Unique Test No: 1  Treadmill:  1  Indication for ETT: A-FIB  Contraindication to ETT: No   Stress Modality: exercise - treadmill  Cardiac Imaging Performed: non   Protocol: standard Bruce - maximal  Max BP:  158/70  Max MPHR (bpm):  177 85% MPR (bpm):  150  MPHR obtained (bpm):171 % MPHR obtained: 98  Reached 85% MPHR (min:sec):  6:15 Total Exercise Time (min-sec):  6:30  Workload in METS 7.7 Borg Scale15  Reason ETT Terminated:  SVT    ST Segment Analysis At Rest: normal ST segments - no evidence of significant ST depression With Exercise: no evidence of significant ST depression  Other Information Arrhythmia:  Yes Angina during ETT:  absent (0) Quality of ETT:  diagnostic  ETT Interpretation:  normal - no evidence of ischemia by ST analysis  Comments:patient developed asymptomatic a short ablation at a rate of 150 260 beats per minute. At the end of the study, he started to go back into sinus rhythm.  Recommendations no change in recommendations. Continue diltiazem 360 mg per day. Exercise to a level of 130-35 beats per minute. Patient made aware that he was at a rate rate of 150 when he went into atrial fibrillation.

## 2010-12-29 ENCOUNTER — Encounter: Payer: Self-pay | Admitting: Family Medicine

## 2010-12-30 ENCOUNTER — Ambulatory Visit (INDEPENDENT_AMBULATORY_CARE_PROVIDER_SITE_OTHER): Payer: 59 | Admitting: Medical

## 2010-12-30 ENCOUNTER — Encounter: Payer: Self-pay | Admitting: Medical

## 2010-12-30 VITALS — BP 110/70 | HR 72 | Temp 97.5°F | Resp 16 | Ht 70.5 in | Wt 233.0 lb

## 2010-12-30 DIAGNOSIS — L219 Seborrheic dermatitis, unspecified: Secondary | ICD-10-CM

## 2010-12-30 DIAGNOSIS — L218 Other seborrheic dermatitis: Secondary | ICD-10-CM

## 2010-12-30 DIAGNOSIS — Z23 Encounter for immunization: Secondary | ICD-10-CM

## 2010-12-30 DIAGNOSIS — J329 Chronic sinusitis, unspecified: Secondary | ICD-10-CM

## 2010-12-30 MED ORDER — AMOXICILLIN 875 MG PO TABS: 875.0000 mg | ORAL_TABLET | Freq: Two times a day (BID) | ORAL | Status: DC

## 2010-12-30 MED ORDER — AMOXICILLIN 500 MG PO TABS: ORAL_TABLET | ORAL | Status: DC

## 2010-12-30 MED ORDER — CLINDAMYCIN PHOSPHATE 1 % EX SWAB: 1.0000 "application " | CUTANEOUS | Status: DC | PRN

## 2010-12-30 NOTE — Progress Notes (Signed)
Subjective:     Alan Thompson is a 44 y.o. male who presents for evaluation of possible sinus infection.   He notes over a week hx/o toothache, sinus pressure, headache, body aches, swollen gland, fever, not feeling well.  He works in [place] a lot, and after he flew back in few days ago felt even worse.  Using Flonase, Ibuprofen with some relief.  Denies hx/o sinus infection, but does have hx/o allergies.  Denies specific sick contacts.  He is a nonsmoker.  Symptoms are worsening.   No other aggravating or relieving factors.    He also requests refill on Clindamycin topical he uses for the lesion on back of neck.  He has used this before for skin lesion on back of neck.  No other c/o.  He wants a flu shot today.   The following portions of the patient's history were reviewed and updated as appropriate: allergies, current medications, past family history, past medical history, past social history, past surgical history and problem list.  Past Medical History  Diagnosis Date  . Atrial fibrillation   . GERD (gastroesophageal reflux disease)   . Allergy     RHINITIS  . Arthritis   . Asthma   . Hyperlipidemia   . PAF (paroxysmal atrial fibrillation)     Review of Systems Constitutional: +fever LGF, chills; denies sweats, anorexia Skin: denies rash HEENT: +sore throat on the left, left ear pressure Cardiovascular: denies chest pain Lungs: denies cough, wheezing Abdomen: denies abdominal pain, nausea, vomiting, diarrhea GU: denies dysuria  Objective:   Filed Vitals:   12/30/10 1138  BP: 110/70  Pulse: 72  Temp: 97.5 F (36.4 C)  Resp: 16    General appearance: Alert, WD/WN, no distress, somewhat ill appearing                             Skin: warm, posterior scalp with small patch of raised erythematous papules, suggestive of seb derm                           Head: + left frontal and maxillary sinus tenderness,                            Eyes: conjunctiva normal,  corneas clear, PERRLA                            Ears: TMs with purulent effusion, external ear canals normal                          Nose: septum midline, turbinates swollen R>L, with erythema and clear discharge             Mouth/throat: MMM, tongue normal, mild pharyngeal erythema                           Neck: supple, tender shoddy adenopathy, no thyromegaly, nontender                          Heart: RRR, normal S1, S2, no murmurs                         Lungs: CTA bilaterally, no wheezes, rales,  or rhonchi      Assessment:   Encounter Diagnoses  Name Primary?  . Sinusitis Yes  . Seborrheic dermatitis of scalp   . Need for prophylactic vaccination and inoculation against influenza      Plan:   Sinusitis - script for Amoxicillin, OTC Mucinex for congestion, Tylenol or Ibuprofen OTC for fever and malaise.  Discussed symptomatic relief, nasal saline, and call or return if worse or not improving in 2-3 days.     Seborrheic dermatitis - refilled Clindamycin  VIS and flu shot given.

## 2010-12-30 NOTE — Patient Instructions (Signed)

## 2011-01-11 LAB — COMPREHENSIVE METABOLIC PANEL
ALT: 20
ALT: 23
AST: 17
AST: 27
Albumin: 3.6
Albumin: 3.7
Alkaline Phosphatase: 41
Alkaline Phosphatase: 54
BUN: 12
BUN: 17
CO2: 27
CO2: 27
Calcium: 8.6
Calcium: 9
Chloride: 104
Chloride: 104
Creatinine, Ser: 1.21
Creatinine, Ser: 1.34
GFR calc Af Amer: >60
GFR calc Af Amer: >60
GFR calc non Af Amer: 59 — ABNORMAL LOW
GFR calc non Af Amer: >60
Glucose, Bld: 101 — ABNORMAL HIGH
Glucose, Bld: 95
Potassium: 3.4 — ABNORMAL LOW
Potassium: 3.8
Sodium: 135
Sodium: 138
Total Bilirubin: 1.2
Total Bilirubin: 1.8 — ABNORMAL HIGH
Total Protein: 6.1
Total Protein: 6.3

## 2011-01-11 LAB — CK TOTAL AND CKMB (NOT AT ARMC)
CK, MB: 1.1
CK, MB: 1.2
CK, MB: 1.3
Relative Index: 0.9
Relative Index: 1
Relative Index: 1.1
Total CK: 104
Total CK: 129
Total CK: 134

## 2011-01-11 LAB — B-NATRIURETIC PEPTIDE (CONVERTED LAB): Pro B Natriuretic peptide (BNP): <30

## 2011-01-11 LAB — POCT I-STAT, CHEM 8
BUN: 20
Calcium, Ion: 1.19
Chloride: 105
Creatinine, Ser: 1.3
Glucose, Bld: 112 — ABNORMAL HIGH
HCT: 40
Hemoglobin: 13.6
Potassium: 3.5
Sodium: 141
TCO2: 25

## 2011-01-11 LAB — POCT CARDIAC MARKERS
CKMB, poc: 2.7
CKMB, poc: <1 — ABNORMAL LOW
Myoglobin, poc: 44.6
Myoglobin, poc: 71.8
Troponin i, poc: <0.05
Troponin i, poc: <0.05

## 2011-01-11 LAB — LIPID PANEL
Cholesterol: 173
HDL: 39 — ABNORMAL LOW
LDL Cholesterol: 86
Total CHOL/HDL Ratio: 4.4
Triglycerides: 241 — ABNORMAL HIGH
VLDL: 48 — ABNORMAL HIGH

## 2011-01-11 LAB — AMYLASE: Amylase: 99

## 2011-01-11 LAB — TROPONIN I
Troponin I: 0.01
Troponin I: <0.01
Troponin I: <0.01

## 2011-01-11 LAB — D-DIMER, QUANTITATIVE: D-Dimer, Quant: 0.43

## 2011-01-11 LAB — CBC
HCT: 39.4
HCT: 40
Hemoglobin: 13.2
Hemoglobin: 13.3
MCHC: 33.1
MCHC: 33.8
MCV: 94
MCV: 94.1
Platelets: 212
Platelets: 219
RBC: 4.19 — ABNORMAL LOW
RBC: 4.25
RDW: 12.3
RDW: 12.7
WBC: 5.8
WBC: 8.4

## 2011-01-11 LAB — TSH: TSH: 1.773

## 2011-01-11 LAB — LIPASE, BLOOD: Lipase: 30

## 2011-01-11 LAB — T4, FREE: Free T4: 1.05

## 2011-01-26 ENCOUNTER — Ambulatory Visit (INDEPENDENT_AMBULATORY_CARE_PROVIDER_SITE_OTHER): Payer: 59 | Admitting: Family Medicine

## 2011-01-26 ENCOUNTER — Encounter: Payer: Self-pay | Admitting: Family Medicine

## 2011-01-26 VITALS — BP 120/80 | HR 78 | Temp 98.2°F | Ht 68.5 in | Wt 236.0 lb

## 2011-01-26 DIAGNOSIS — J019 Acute sinusitis, unspecified: Secondary | ICD-10-CM

## 2011-01-26 MED ORDER — AMOXICILLIN-POT CLAVULANATE 875-125 MG PO TABS: 1.0000 | ORAL_TABLET | Freq: Two times a day (BID) | ORAL | Status: DC

## 2011-01-26 NOTE — Progress Notes (Signed)
  Subjective:    Patient ID: Alan Thompson, male    DOB: [birth year], 45 y.o.   MRN: [id number]  HPI He is here for a recheck. He was treated approximately one month ago for sinus infection. He did get roughly 50% better. Within the last several days the sinus symptoms are gotten worse specifically more trouble with sinus congestion, headache, pressure, PND. No fever, chills. He does not smoke and has no full-time allergies. His work requires him to fly early often.  Review of Systems     Objective:   Physical Exam alert and in no distress. Tympanic membranes and canals are normal. Throat is clear. Tonsils are normal. Neck is supple without adenopathy or thyromegaly. Cardiac exam shows a regular sinus rhythm without murmurs or gallops. Lungs are clear to auscultation. Nasal mucosa is red with tenderness especially over ethmoid sinuses      Assessment & Plan:   1. Sinusitis, acute    I will treat him with Augmentin. He is to call for entirely better. Also recommend Afrin nasal spray for flying.

## 2011-01-26 NOTE — Patient Instructions (Signed)
Take all the antibiotic and if not totally back to normal call me. Use Afrin nasal spray when you fly to help equilibrate the pressure; do this before you get on a plane

## 2011-02-10 ENCOUNTER — Encounter: Payer: Self-pay | Admitting: Family Medicine

## 2011-02-10 ENCOUNTER — Ambulatory Visit (INDEPENDENT_AMBULATORY_CARE_PROVIDER_SITE_OTHER): Payer: 59 | Admitting: Family Medicine

## 2011-02-10 VITALS — BP 122/80 | HR 62 | Wt 236.0 lb

## 2011-02-10 DIAGNOSIS — J329 Chronic sinusitis, unspecified: Secondary | ICD-10-CM

## 2011-02-10 MED ORDER — AMOXICILLIN-POT CLAVULANATE 875-125 MG PO TABS: 1.0000 | ORAL_TABLET | Freq: Two times a day (BID) | ORAL | Status: AC

## 2011-02-10 NOTE — Patient Instructions (Signed)
Take all the antibiotic and call me if you're not totally back to normal 

## 2011-02-10 NOTE — Progress Notes (Signed)
  Subjective:    Patient ID: Alan Thompson, male    DOB: 31-Aug-1965, 45 y.o.   MRN: [id number]  HPI Here for recheck on his sinus infection. He has been on Augmentin for 2 weeks. He states he is roughly 75% better.   Review of Systems     Objective:   Physical Exam alert and in no distress. Tympanic membranes and canals are normal. Throat is clear. Tonsils are normal. Neck is supple without adenopathy or thyromegaly. Cardiac exam shows a regular sinus rhythm without murmurs or gallops. Lungs are clear to auscultation. Nasal mucosa is normal. He is tender over his frontal sinus only       Assessment & Plan:  Sinusitis, now resolving Continue on Augmentin. Encouraged him to take a full amount of the medication and if not fully back to normal, call me

## 2011-02-12 ENCOUNTER — Other Ambulatory Visit: Payer: Self-pay | Admitting: Family Medicine

## 2011-02-28 ENCOUNTER — Encounter: Payer: Self-pay | Admitting: Family Medicine

## 2011-02-28 ENCOUNTER — Ambulatory Visit (INDEPENDENT_AMBULATORY_CARE_PROVIDER_SITE_OTHER): Payer: 59 | Admitting: Family Medicine

## 2011-02-28 VITALS — BP 110/80 | HR 60 | Wt 235.0 lb

## 2011-02-28 DIAGNOSIS — J321 Chronic frontal sinusitis: Secondary | ICD-10-CM

## 2011-02-28 MED ORDER — AMOXICILLIN-POT CLAVULANATE 875-125 MG PO TABS: 1.0000 | ORAL_TABLET | Freq: Two times a day (BID) | ORAL | Status: DC

## 2011-02-28 NOTE — Progress Notes (Signed)
  Subjective:    Patient ID: Alan Thompson, male    DOB: [birth year], 45 y.o.   MRN: [id number]  HPI He is here for recheck on sinusitis. He has a previous history of sinus infections and has been on amoxicillin and switch to Augmentin. He has had 2 doses of Augmentin with relief while he is on the Augmentin however when he stops at the symptoms return. He complains of sinus pressure, sore throat, watery eyes with PND. No fever, chills, coughing, earache.   Review of Systems     Objective:   Physical Exam alert and in no distress. Tympanic membranes and canals are normal. Throat is clear. Tonsils are normal. Neck is supple without adenopathy or thyromegaly. Cardiac exam shows a regular sinus rhythm without murmurs or gallops. Lungs are clear to auscultation. His mucosa is slightly red. Tender to palpation mainly over maxillary sinuses.      Assessment & Plan:   1. Sinusitis chronic, frontal  CT Maxillofacial LTD WO CM   I will also place him on Augmentin and given a Sterapred Dosepak. If the CT scan is positive I will then refer to ENT based on that.

## 2011-02-28 NOTE — Patient Instructions (Signed)
I will call you with the results of the scan

## 2011-03-01 ENCOUNTER — Telehealth: Payer: Self-pay | Admitting: Family Medicine

## 2011-03-01 NOTE — Telephone Encounter (Signed)
FAXED [facility]

## 2011-03-03 ENCOUNTER — Ambulatory Visit
Admission: RE | Admit: 2011-03-03 | Discharge: 2011-03-03 | Disposition: A | Discharge status: Home / Self Care | Payer: 59 | Source: Ambulatory Visit | Attending: Family Medicine | Admitting: Family Medicine

## 2011-03-03 DIAGNOSIS — J321 Chronic frontal sinusitis: Secondary | ICD-10-CM

## 2011-03-07 ENCOUNTER — Other Ambulatory Visit: Payer: 59

## 2011-03-16 ENCOUNTER — Ambulatory Visit: Payer: 59 | Admitting: Family Medicine

## 2011-03-17 ENCOUNTER — Ambulatory Visit (INDEPENDENT_AMBULATORY_CARE_PROVIDER_SITE_OTHER): Payer: 59 | Admitting: Family Medicine

## 2011-03-17 ENCOUNTER — Encounter: Payer: Self-pay | Admitting: Family Medicine

## 2011-03-17 ENCOUNTER — Other Ambulatory Visit: Payer: Self-pay

## 2011-03-17 VITALS — BP 116/82 | HR 55 | Wt 230.0 lb

## 2011-03-17 DIAGNOSIS — J329 Chronic sinusitis, unspecified: Secondary | ICD-10-CM

## 2011-03-17 MED ORDER — LEVOFLOXACIN 500 MG PO TABS: 500.0000 mg | ORAL_TABLET | Freq: Every day | ORAL | Status: AC

## 2011-03-17 NOTE — Patient Instructions (Signed)
Call me at the end of the course of the antibiotic and steroid and let me know how you're doing.

## 2011-03-17 NOTE — Progress Notes (Signed)
  Subjective:    Patient ID: Alan Thompson, male    DOB: 1965-09-18, 45 y.o.   MRN: [id number]  HPI He is here for recheck. He states that while he was on the steroids he essentially fell back to normal however at the end of the course of steroid and the antibiotic he noted his sinus symptoms recurring with some discomfort as well as drainage and some malaise.   Review of Systems     Objective:   Physical Exam Alert and in no distress otherwise not examined. CT scan report was reviewed.       Assessment & Plan:  Unresolved sinusitis I discussed options with him. He would like to be placed back on steroids. I discussed the possible side effects with him and will reluctantly give him another course. I will also switch him to Levaquin. He is to call me at the end of the course of treatment and if still having symptoms, I will refer to ENT.

## 2011-03-31 ENCOUNTER — Ambulatory Visit (INDEPENDENT_AMBULATORY_CARE_PROVIDER_SITE_OTHER): Payer: 59 | Admitting: Family Medicine

## 2011-03-31 ENCOUNTER — Encounter: Payer: Self-pay | Admitting: Family Medicine

## 2011-03-31 DIAGNOSIS — J329 Chronic sinusitis, unspecified: Secondary | ICD-10-CM

## 2011-03-31 MED ORDER — AMOXICILLIN-POT CLAVULANATE 875-125 MG PO TABS: 1.0000 | ORAL_TABLET | Freq: Two times a day (BID) | ORAL | Status: DC

## 2011-03-31 MED ORDER — AMOXICILLIN-POT CLAVULANATE 875-125 MG PO TABS: 1.0000 | ORAL_TABLET | Freq: Two times a day (BID) | ORAL | Status: AC

## 2011-03-31 NOTE — Progress Notes (Signed)
  Subjective:    Patient ID: Alan Thompson, male    DOB: Aug 12, 1965, 45 y.o.   MRN: [id number]  HPI He continues to have difficulty with sinus congestion, postnasal drainage etc. He states that the Levaquin and steroids were not as effective as the Augmentin.   Review of Systems     Objective:   Physical Exam Alert and in no distress otherwise not examined       Assessment & Plan:  Chronic sinusitis. I will refer to ENT since he has flunked multiple antibiotics and steroids. I will keep him on Augmentin until he is seen by ENT

## 2011-06-30 ENCOUNTER — Other Ambulatory Visit: Payer: Self-pay | Admitting: Family Medicine

## 2011-08-07 ENCOUNTER — Other Ambulatory Visit: Payer: Self-pay | Admitting: Cardiology

## 2011-08-07 ENCOUNTER — Other Ambulatory Visit: Payer: Self-pay

## 2011-08-07 MED ORDER — DILTIAZEM HCL ER COATED BEADS 360 MG PO CP24: 360.0000 mg | ORAL_CAPSULE | Freq: Every day | ORAL | Status: DC

## 2011-10-11 ENCOUNTER — Other Ambulatory Visit: Payer: Self-pay | Admitting: Cardiology

## 2011-11-13 ENCOUNTER — Other Ambulatory Visit: Payer: Self-pay | Admitting: Cardiology

## 2011-12-25 ENCOUNTER — Encounter: Payer: Self-pay | Admitting: Family Medicine

## 2011-12-25 ENCOUNTER — Ambulatory Visit (INDEPENDENT_AMBULATORY_CARE_PROVIDER_SITE_OTHER): Payer: 59 | Admitting: Family Medicine

## 2011-12-25 VITALS — BP 120/80 | HR 104 | Wt 242.0 lb

## 2011-12-25 DIAGNOSIS — M549 Dorsalgia, unspecified: Secondary | ICD-10-CM

## 2011-12-25 DIAGNOSIS — Z23 Encounter for immunization: Secondary | ICD-10-CM

## 2011-12-25 DIAGNOSIS — J069 Acute upper respiratory infection, unspecified: Secondary | ICD-10-CM

## 2011-12-25 NOTE — Progress Notes (Signed)
  Subjective:    Patient ID: Alan Thompson, male    DOB: Nov 30, 1965, 46 y.o.   MRN: [id number]  HPI Three days ago he had fatigue, sinus pressure and headache. He uses Flonase and Claritin and is feeling better. Slight difficulty with back ache. It is made worse with motion but not necessarily always the same motion each time   Review of Systems     Objective:   Physical Exam alert and in no distress. Tympanic membranes and canals are normal. Throat is clear. Tonsils are normal. Neck is supple without adenopathy or thyromegaly. Cardiac exam shows an irregular sinus rhythm without murmurs or gallops. Lungs are clear to auscultation. Tender to palpation over the right mid paravertebral muscles.        Assessment & Plan:   1. URI (upper respiratory infection)   2. Back pain    supportive care for the URI. Recommend heat, stretching, proper posturing for his back. Flu shot given. Risk versus benefit was discussed.

## 2012-02-06 ENCOUNTER — Other Ambulatory Visit: Payer: Self-pay | Admitting: Cardiology

## 2012-03-04 ENCOUNTER — Telehealth: Payer: Self-pay | Admitting: Family Medicine

## 2012-03-04 MED ORDER — OMEPRAZOLE 40 MG PO CPDR: 40.0000 mg | DELAYED_RELEASE_CAPSULE | Freq: Every day | ORAL | Status: DC

## 2012-03-04 NOTE — Telephone Encounter (Signed)
Sent in med [facility]

## 2012-05-21 ENCOUNTER — Other Ambulatory Visit: Payer: Self-pay | Admitting: Cardiology

## 2012-05-28 ENCOUNTER — Other Ambulatory Visit: Payer: Self-pay | Admitting: Cardiology

## 2012-06-21 ENCOUNTER — Telehealth: Payer: Self-pay

## 2012-06-21 ENCOUNTER — Other Ambulatory Visit: Payer: Self-pay

## 2012-06-21 MED ORDER — DILTIAZEM HCL ER COATED BEADS 360 MG PO CP24: ORAL_CAPSULE | ORAL | Status: DC

## 2012-06-21 NOTE — Telephone Encounter (Signed)
  Pt called to rqst refill for diltiazem. Scheduled pt to  See Dr.Wall 6/14. Refill sent for 30 R-3

## 2012-08-01 ENCOUNTER — Encounter: Payer: Self-pay | Admitting: Family Medicine

## 2012-08-01 ENCOUNTER — Ambulatory Visit (INDEPENDENT_AMBULATORY_CARE_PROVIDER_SITE_OTHER): Payer: 59 | Admitting: Family Medicine

## 2012-08-01 VITALS — BP 120/80 | HR 78 | Wt 241.0 lb

## 2012-08-01 DIAGNOSIS — L989 Disorder of the skin and subcutaneous tissue, unspecified: Secondary | ICD-10-CM

## 2012-08-01 DIAGNOSIS — J309 Allergic rhinitis, unspecified: Secondary | ICD-10-CM

## 2012-08-01 DIAGNOSIS — J45909 Unspecified asthma, uncomplicated: Secondary | ICD-10-CM | POA: Insufficient documentation

## 2012-08-01 DIAGNOSIS — J302 Other seasonal allergic rhinitis: Secondary | ICD-10-CM | POA: Insufficient documentation

## 2012-08-01 MED ORDER — FLUTICASONE PROPIONATE 50 MCG/ACT NA SUSP: 2.0000 | NASAL | Status: DC | PRN

## 2012-08-01 MED ORDER — MOMETASONE FUROATE 220 MCG/INH IN AEPB: 2.0000 | INHALATION_SPRAY | Freq: Every day | RESPIRATORY_TRACT | Status: DC

## 2012-08-01 NOTE — Progress Notes (Signed)
  Subjective:    Patient ID: Alan Thompson, male    DOB: 06-05-1965, 47 y.o.   MRN: [id number]  HPI He is here for consult. He has had difficulty over the last several years with a rash that occurs mainly on his torso and usually during the winter months. He notes pigmentation and slight dryness. Apparently this does clear up in the summer and when he uses cortisone creams. He also has an underlying history of seasonal allergies as well as asthma. He was seen by an allergist and placed on Asmanex as well as a Flonase. He apparently also had skin testing done.   Review of Systems     Objective:   Physical Exam Alert and in no distress. Exam of his back shows multiple pigmented round lesions with central clearing that are not raised. It almost appears that they're in the skin fold lines.       Assessment & Plan:  Allergic rhinitis, seasonal - Plan: fluticasone (FLONASE) 50 MCG/ACT nasal spray  Extrinsic asthma, unspecified - Plan: mometasone (ASMANEX) 220 MCG/INH inhaler  Skin lesions, generalized - Plan: Ambulatory referral to Dermatology explained that I was not sure what the skin lesions were and would refer to dermatology. I will renew his Asmanex. He uses this mainly during the spring. Discussed allergy testing with him. Strongly encouraged him to call me before he goes back to the allergist.

## 2012-09-18 ENCOUNTER — Ambulatory Visit: Payer: 59 | Admitting: Cardiology

## 2012-10-16 ENCOUNTER — Encounter: Payer: Self-pay | Admitting: *Deleted

## 2012-10-18 ENCOUNTER — Telehealth: Payer: Self-pay | Admitting: *Deleted

## 2012-10-18 MED ORDER — DILTIAZEM HCL ER COATED BEADS 360 MG PO CP24: ORAL_CAPSULE | ORAL | Status: DC

## 2012-10-18 NOTE — Telephone Encounter (Signed)
Pt aware refill for Diltiazem has been called in. States he has been out of medication since Sunday. We have not received refill request. Pt states he is not in atrial fib " it is just precautionary"  Pt already has scheduled appointment with Dr. Daleen Squibb on 10/23/12 Mylo Red RN

## 2012-10-18 NOTE — Telephone Encounter (Signed)
Pt states he needs a refill on Diltiazem. Dr. Daleen Squibb has  Not seen patient since 2012 is this ok to fill until appt. Willl route this to DR. Wall's nurse

## 2012-10-23 ENCOUNTER — Encounter: Payer: Self-pay | Admitting: Cardiology

## 2012-10-23 ENCOUNTER — Ambulatory Visit (INDEPENDENT_AMBULATORY_CARE_PROVIDER_SITE_OTHER): Payer: 59 | Admitting: Cardiology

## 2012-10-23 VITALS — BP 112/81 | HR 54 | Ht 70.0 in | Wt 244.6 lb

## 2012-10-23 DIAGNOSIS — I4891 Unspecified atrial fibrillation: Secondary | ICD-10-CM

## 2012-10-23 NOTE — Assessment & Plan Note (Signed)
No clinical recurrence. No change in medication. We'll continue full dose aspirin for now. I'll schedule followup with Dr.Nishan in one year. Patient advised continue to follow along closely with his primary care physician for annual blood work and maintenance care.

## 2012-10-23 NOTE — Progress Notes (Signed)
HPI Alan Thompson returns today for evaluation and management of a history of paroxysmal atrial fibrillation. Other than mild mitral regurgitation, abnormal echocardiogram when this was first diagnosed. We have treated with diltiazem and aspirin. He has not had a clinical recurrence.  His blood work is followed by Dr.LaLonde. He states his blood pressures been under good control.  Past Medical History  Diagnosis Date  . Atrial fibrillation   . GERD (gastroesophageal reflux disease)   . Allergy     RHINITIS  . Arthritis   . Asthma   . Hyperlipidemia   . PAF (paroxysmal atrial fibrillation)   . H/O hiatal hernia     Current Outpatient Prescriptions  Medication Sig Dispense Refill  . aspirin 325 MG EC tablet Take 325 mg by mouth daily.        . clindamycin (CLEOCIN T) 1 % SWAB Apply 1 application topically as needed.  30 Package  1  . diltiazem (CARDIZEM CD) 360 MG 24 hr capsule TAKE 1 CAPSULE BY MOUTH ONCE DAILY  30 capsule  3  . fluticasone (FLONASE) 50 MCG/ACT nasal spray Place 2 sprays into the nose as needed.  48 g  3  . mometasone (ASMANEX) 220 MCG/INH inhaler Inhale 2 puffs into the lungs daily.  3 Inhaler  3   No current facility-administered medications for this visit.    No Known Allergies  No family history on file.  History   Social History  . Marital Status: Married    Spouse Name: N/A    Number of Children: N/A  . Years of Education: N/A   Occupational History  . Not on file.   Social History Main Topics  . Smoking status: Former Games developer  . Smokeless tobacco: Never Used  . Alcohol Use: 0.5 oz/week    1 drink(s) per week  . Drug Use: No  . Sexually Active: Not on file   Other Topics Concern  . Not on file   Social History Narrative  . No narrative on file    ROS ALL NEGATIVE EXCEPT THOSE NOTED IN HPI  PE  General Appearance: well developed, well nourished in no acute distress HEENT: symmetrical face, PERRLA, good dentition  Neck: no JVD,  thyromegaly, or adenopathy, trachea midline Chest: symmetric without deformity Cardiac: PMI non-displaced, RRR, normal S1, S2, no gallop or murmur Lung: clear to ausculation and percussion Vascular: all pulses full without bruits  Abdominal: nondistended, nontender, good bowel sounds, no HSM, no bruits Extremities: no cyanosis, clubbing or edema, no sign of DVT, no varicosities  Skin: normal color, no rashes Neuro: alert and oriented x 3, non-focal Pysch: normal affect  EKG Sinus bradycardia, otherwise normal EKG. BMET    Component Value Date/Time   NA 132* 03/28/2009 0526   K 3.7 03/28/2009 0526   CL 96 03/28/2009 0526   CO2 29 03/28/2009 0526   GLUCOSE 111* 03/28/2009 0526   BUN 10 03/28/2009 0526   CREATININE 1.19 03/28/2009 0526   CALCIUM 8.1* 03/28/2009 0526   GFRNONAA >60 03/28/2009 0526   GFRAA  Value: >60        The eGFR has been calculated using the MDRD equation. This calculation has not been validated in all clinical situations. eGFR's persistently <60 mL/min signify possible Chronic Kidney Disease. 03/28/2009 0526    Lipid Panel     Component Value Date/Time   CHOL  Value: 173        ATP III CLASSIFICATION:  <200     mg/dL  Desirable  200-239  mg/dL   Borderline High  >=454    mg/dL   High 09/81/1914 7829   TRIG 241* 02/23/2008 0331   HDL 39* 02/23/2008 0331   CHOLHDL 4.4 02/23/2008 0331   VLDL 48* 02/23/2008 0331   LDLCALC  Value: 86        Total Cholesterol/HDL:CHD Risk Coronary Heart Disease Risk Table                     Men   Women  1/2 Average Risk   3.4   3.3 02/23/2008 0331    CBC    Component Value Date/Time   WBC 10.3 03/28/2009 0526   RBC 2.70* 03/28/2009 0526   HGB 8.7* 03/28/2009 0526   HCT 25.5* 03/28/2009 0526   PLT 183 03/28/2009 0526   MCV 94.3 03/28/2009 0526   MCHC 34.1 03/28/2009 0526   RDW 12.4 03/28/2009 0526

## 2012-10-23 NOTE — Patient Instructions (Addendum)
Your physician wants you to follow-up in: 1 year with Dr. Peter Nishan.  You will receive a reminder letter in the mail two months in advance. If you don't receive a letter, please call our office to schedule the follow-up appointment.  Your physician recommends that you continue on your current medications as directed. Please refer to the Current Medication list given to you today.  

## 2012-12-04 ENCOUNTER — Telehealth: Payer: Self-pay | Admitting: Cardiology

## 2012-12-04 NOTE — Telephone Encounter (Signed)
New Prob  Pt believes he is in afib and would like to speak to a nurse.

## 2012-12-04 NOTE — Telephone Encounter (Signed)
Patient complaining of being lethargic and having a hard time focusing at work for the past few days. He said that he couldn't feel heartbeat irregularity and counted pulse at 78 bt/min.  He is currently in [place] and will be driving back Thursday afternoon (states he should be home around 6pm). I will forward to Mylo Red for review with Dr. Daleen Squibb tomorrow to see if he needs to come in for an EKG. I explained to patient to call us back if increased symptoms arised and that we would be in touch tomorrow after reviewing with Dr. Daleen Squibb.

## 2012-12-05 NOTE — Telephone Encounter (Signed)
Dr. Daleen Squibb has reviewed & pt should come in on Friday for an EKG & bp check  I spoke with pt and he has not had any episodes today. He will come in tomorrow for an EKG & BP check  Mylo Red RN

## 2012-12-06 ENCOUNTER — Ambulatory Visit (INDEPENDENT_AMBULATORY_CARE_PROVIDER_SITE_OTHER): Payer: 59

## 2012-12-06 VITALS — BP 122/82 | HR 90 | Resp 18 | Ht 70.0 in | Wt 246.0 lb

## 2012-12-06 DIAGNOSIS — I4891 Unspecified atrial fibrillation: Secondary | ICD-10-CM

## 2012-12-06 NOTE — Progress Notes (Signed)
Patient came in for Nurse visit today, ECG done, Vitals and weight checked. Patient had no c/o pain, denies dyspnea, only c/o increased lethargy lately. Patient states he has had palpitations and thinks he is in A. Fib. Once again. ECG shows A.Fib at controlled rate of 90-92, DOD Ladona Ridgel) made aware of ECG and signed off on it. Vitals WNL. Per DOD continue current meds, no changes, and see Dr. Eden Emms in Sept 2014, instead of 1 year. Pt. Advised we will call him with any changes and to schedule an appt.

## 2013-01-06 IMAGING — CT CT PARANASAL SINUSES LIMITED
1 series · 9 of 11 positions shown, 12 images · non-contrast
Comparison: None

CLINICAL DATA: Chronic sinusitis, with left greater than right
pain, headaches, and fever.

CT LIMITED SINUSES WITHOUT CONTRAST
TECHNIQUE: Multidetector CT images of the paranasal sinuses were
obtained in a single plane without contrast. Images were obtained
in a non-contiguous method.

[Series 3: ax soft · axial · 0.33mm/px · z∈[+62,+142]mm · 9 of 11 slices shown, 12 images]
[im 2/11  brain]
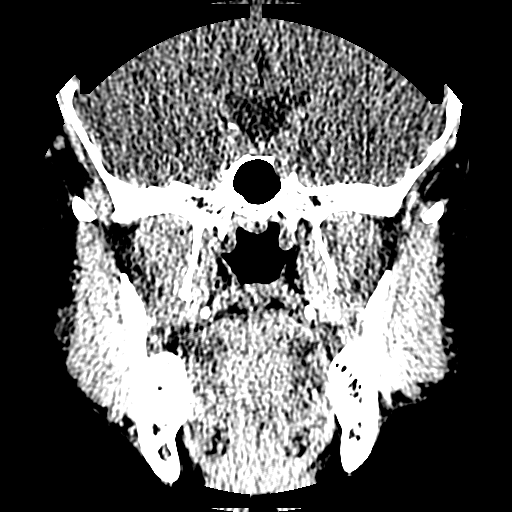
[im 2/11  bone]
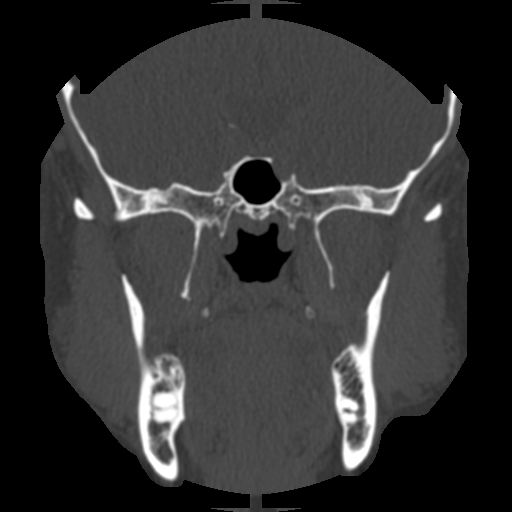
[im 3/11  bone]
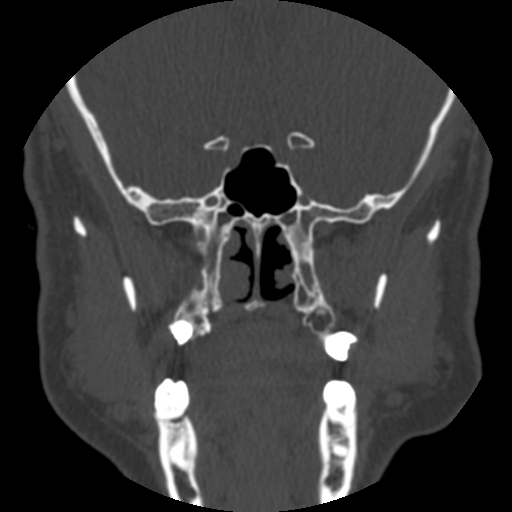
[im 4/11  bone]
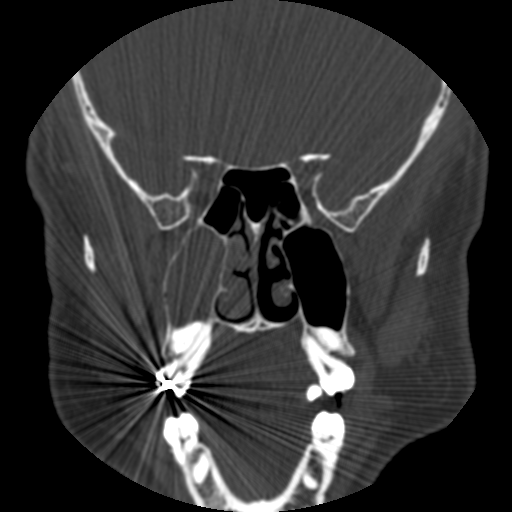
[im 5/11  bone]
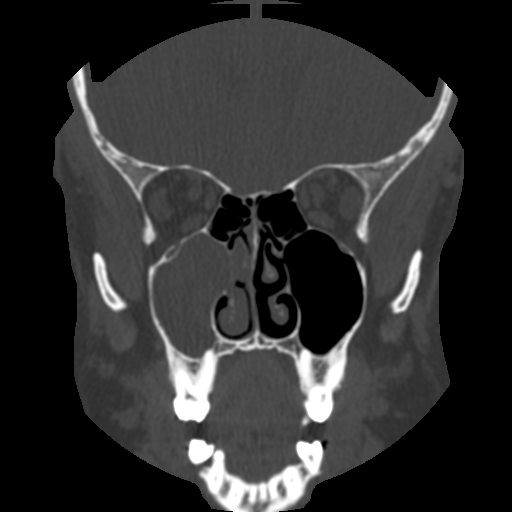
[im 6/11  brain]
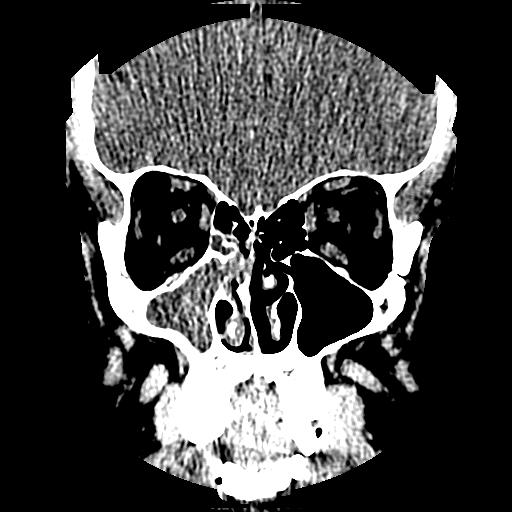
[im 6/11  bone]
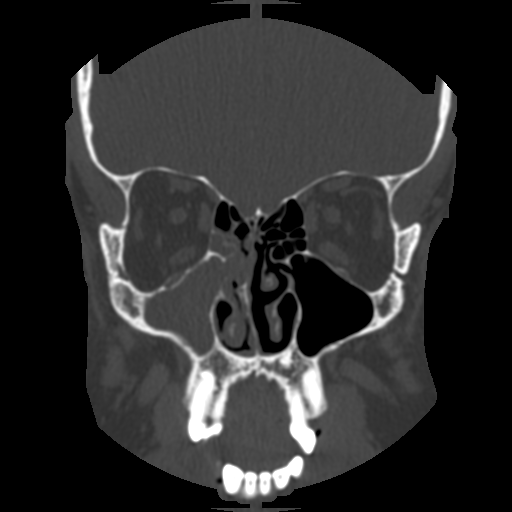
[im 7/11  bone]
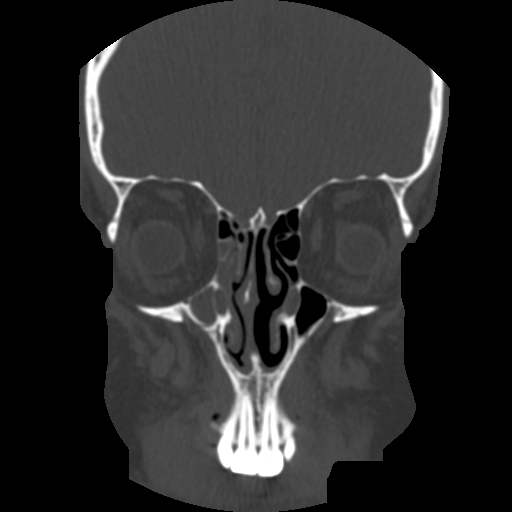
[im 8/11  bone]
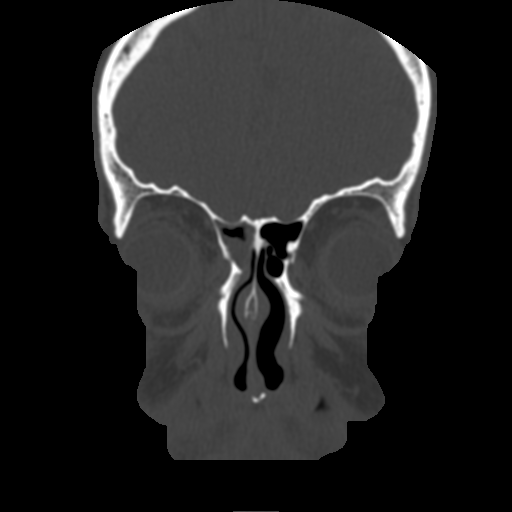
[im 9/11  bone]
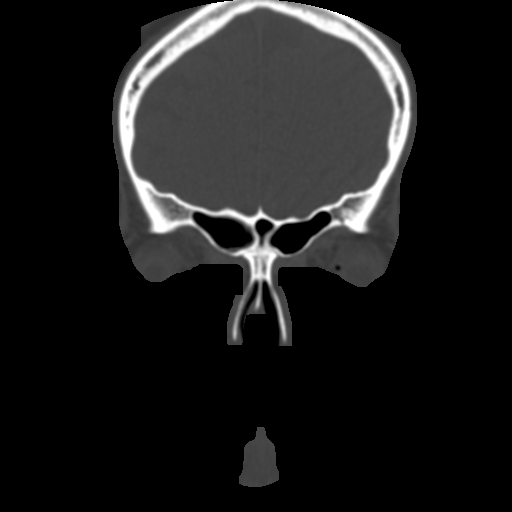
[im 10/11  brain]
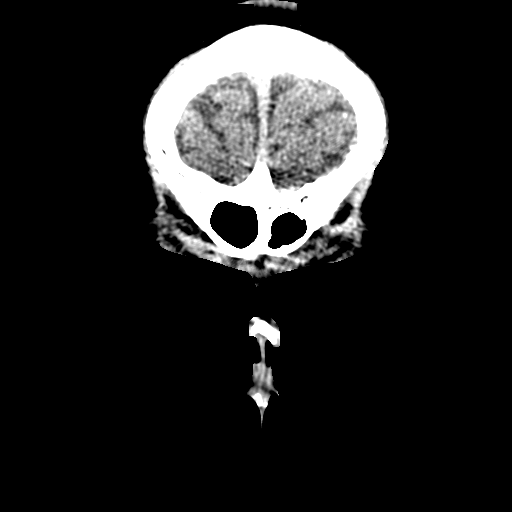
[im 10/11  bone]
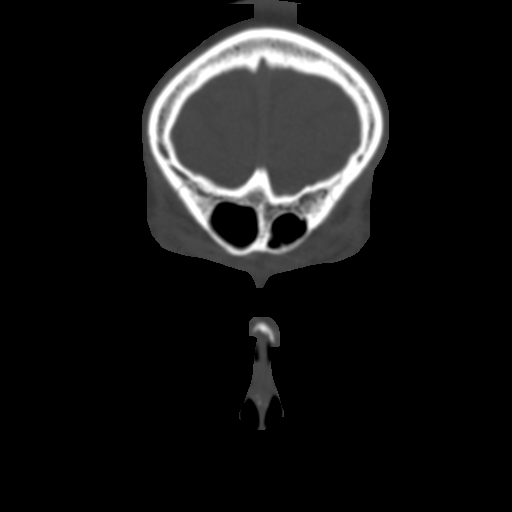

[9 of 11 positions shown; findings below may reference images not displayed]

FINDINGS: A vitamin E capsule marks the patient's right side

There is complete opacification of the visualized portion of the
left maxillary sinus, with mucosal thickening in the left nasal
cavity and with opacification of several visualized ethmoid air
cells.  Mucoperiosteal thickening is present in the left frontal
sinus.
IMPRESSION: 1.  Chronic sinusitis involving the left frontal sinus and several
left ethmoid air cells.
2.  Complete opacification of visualized portion of the left
maxillary sinus could be from acute or chronic sinusitis.
3.  Mucosal swelling in the left nasal cavity.

## 2013-02-02 ENCOUNTER — Other Ambulatory Visit: Payer: Self-pay | Admitting: Cardiology

## 2013-02-05 ENCOUNTER — Ambulatory Visit (INDEPENDENT_AMBULATORY_CARE_PROVIDER_SITE_OTHER): Payer: 59 | Admitting: Family Medicine

## 2013-02-05 VITALS — BP 120/78 | HR 79 | Wt 248.0 lb

## 2013-02-05 DIAGNOSIS — M7020 Olecranon bursitis, unspecified elbow: Secondary | ICD-10-CM

## 2013-02-05 DIAGNOSIS — Z23 Encounter for immunization: Secondary | ICD-10-CM

## 2013-02-05 DIAGNOSIS — M7022 Olecranon bursitis, left elbow: Secondary | ICD-10-CM

## 2013-02-05 NOTE — Progress Notes (Signed)
  Subjective:    Patient ID: Alan Thompson, male    DOB: [birth year], 47 y.o.   MRN: [id number]  HPI He is here for evaluation of swelling of the left elbow. He has no history of recent injury. He does feel slight pain when he palpates in this area.   Review of Systems     Objective:   Physical Exam Full motion of the left elbow. The elbow olecranon bursa is slightly warm but not tender to palpation. Small indentation is noted in the olecranon bony area.       Assessment & Plan:  Need for prophylactic vaccination and inoculation against influenza - Plan: Flu Vaccine QUAD 36+ mos PF IM (Fluarix)  Olecranon bursitis of left elbow  explained bursitis to him and at this time no further intervention is necessary other than heat and anti-inflammatory. If he has further difficulty, he will come back. Flu shot given with risks and benefits discussed.

## 2013-04-28 ENCOUNTER — Other Ambulatory Visit: Payer: Self-pay

## 2013-04-28 MED ORDER — DILTIAZEM HCL ER BEADS 360 MG PO CP24: ORAL_CAPSULE | ORAL | Status: DC

## 2013-05-22 ENCOUNTER — Encounter: Payer: Self-pay | Admitting: Family Medicine

## 2013-05-22 ENCOUNTER — Ambulatory Visit (INDEPENDENT_AMBULATORY_CARE_PROVIDER_SITE_OTHER): Payer: BC Managed Care – PPO | Admitting: Family Medicine

## 2013-05-22 VITALS — BP 120/70 | HR 60 | Wt 245.0 lb

## 2013-05-22 DIAGNOSIS — E785 Hyperlipidemia, unspecified: Secondary | ICD-10-CM

## 2013-05-22 DIAGNOSIS — K219 Gastro-esophageal reflux disease without esophagitis: Secondary | ICD-10-CM

## 2013-05-22 LAB — LIPID PANEL
Cholesterol: 234 mg/dL — ABNORMAL HIGH (ref 0–200)
HDL: 44 mg/dL (ref >39)
LDL Cholesterol: 138 mg/dL — ABNORMAL HIGH (ref 0–99)
Total CHOL/HDL Ratio: 5.3 Ratio
Triglycerides: 260 mg/dL — ABNORMAL HIGH (ref <150)
VLDL: 52 mg/dL — ABNORMAL HIGH (ref 0–40)

## 2013-05-22 MED ORDER — OMEPRAZOLE 40 MG PO CPDR: 40.0000 mg | DELAYED_RELEASE_CAPSULE | Freq: Every day | ORAL | Status: DC

## 2013-05-22 NOTE — Progress Notes (Signed)
   Subjective:    Patient ID: Alan Thompson, male    DOB: [birth year], 48 y.o.   MRN: [id number]  HPI He is here for a recheck. He has been on a cholesterol-lowering diet for approximately 3 months and did stop his statin. He would like to see what his numbers look like without medication. Review his record indicates that he does not have a family history of heart disease, diabetes or hypertension. He does have reflux disease and would like a refill on his Prilosec.   Review of Systems     Objective:   Physical Exam Alert and in no distress otherwise not examined       Assessment & Plan:  GERD (gastroesophageal reflux disease) - Plan: omeprazole (PRILOSEC) 40 MG capsule  Hyperlipidemia LDL goal < 130 - Plan: Lipid panel

## 2013-06-02 ENCOUNTER — Encounter: Payer: Self-pay | Admitting: Cardiovascular Disease

## 2013-06-03 ENCOUNTER — Ambulatory Visit: Payer: 59 | Admitting: Cardiovascular Disease

## 2013-06-13 ENCOUNTER — Encounter: Payer: Self-pay | Admitting: Cardiovascular Disease

## 2013-06-13 ENCOUNTER — Ambulatory Visit (INDEPENDENT_AMBULATORY_CARE_PROVIDER_SITE_OTHER): Payer: BC Managed Care – PPO | Admitting: Cardiovascular Disease

## 2013-06-13 VITALS — BP 126/80 | HR 87 | Ht 70.0 in | Wt 249.0 lb

## 2013-06-13 DIAGNOSIS — I059 Rheumatic mitral valve disease, unspecified: Secondary | ICD-10-CM

## 2013-06-13 DIAGNOSIS — I4891 Unspecified atrial fibrillation: Secondary | ICD-10-CM

## 2013-06-13 DIAGNOSIS — I340 Nonrheumatic mitral (valve) insufficiency: Secondary | ICD-10-CM

## 2013-06-13 LAB — BASIC METABOLIC PANEL
BUN: 19 mg/dL (ref 6–23)
CO2: 25 mEq/L (ref 19–32)
Calcium: 9 mg/dL (ref 8.4–10.5)
Chloride: 103 mEq/L (ref 96–112)
Creat: 1.16 mg/dL (ref 0.50–1.35)
Glucose, Bld: 97 mg/dL (ref 70–99)
Potassium: 3.9 mEq/L (ref 3.5–5.3)
Sodium: 137 mEq/L (ref 135–145)

## 2013-06-13 MED ORDER — APIXABAN 5 MG PO TABS: 5.0000 mg | ORAL_TABLET | Freq: Two times a day (BID) | ORAL | Status: DC

## 2013-06-13 NOTE — Progress Notes (Signed)
Patient ID: Alan Thompson, male   DOB: [birth year], 48 y.o.   MRN: [id number] Alan Thompson returns today for evaluation and management of a history of paroxysmal atrial fibrillation. Other than mild mitral regurgitation, abnormal echocardiogram when this was first diagnosed. We have treated with diltiazem and aspirin. Last seen by Dr Verl Blalock 2014 New to me  His blood work is followed by Dr.LaLonde. He states his blood pressures been under good control.  Started feeling like he was back in afib in September.  Called office but no immediate appt made.  He had trip planned to [facility] and was hoping things would return to normal so didn't push for sooner appt.  Has had more exertional dyspnea fatigue since being in afib.  No chest pain.  No contraindications to anticoagulation.  Does not want to take coumadin   Echo 2012 Study Conclusions  - Left ventricle: The cavity size was normal. Wall thickness was normal. Systolic function was normal. The estimated ejection fraction was in the range of 55% to 60%. Wall motion was normal; there were no regional wall motion abnormalities. - Aortic valve: Trivial regurgitation. - Mitral valve: Mild regurgitation.     ROS: Denies fever, malais, weight loss, blurry vision, decreased visual acuity, cough, sputum, SOB, hemoptysis, pleuritic pain, palpitaitons, heartburn, abdominal pain, melena, lower extremity edema, claudication, or rash.  All other systems reviewed and negative  General: Affect appropriate Healthy:  appears stated age 8: normal Neck supple with no adenopathy JVP normal no bruits no thyromegaly Lungs clear with no wheezing and good diaphragmatic motion Heart:  S1/S2 no murmur, no rub, gallop or click PMI normal Abdomen: benighn, BS positve, no tenderness, no AAA no bruit.  No HSM or HJR Distal pulses intact with no bruits No edema Neuro non-focal Skin warm and dry No muscular weakness   Current Outpatient Prescriptions  Medication  Sig Dispense Refill  . aspirin 325 MG EC tablet Take 325 mg by mouth daily.        . clindamycin (CLEOCIN T) 1 % SWAB Apply 1 application topically as needed.  30 Package  1  . diltiazem (TIAZAC) 360 MG 24 hr capsule TAKE 1 CAPSULE BY MOUTH ONCE DAILY  30 capsule  3  . fluticasone (FLONASE) 50 MCG/ACT nasal spray Place 2 sprays into the nose as needed.  48 g  3  . mometasone (ASMANEX) 220 MCG/INH inhaler Inhale 2 puffs into the lungs daily.  3 Inhaler  3  . omeprazole (PRILOSEC) 40 MG capsule Take 1 capsule (40 mg total) by mouth daily.  90 capsule  3  . [DISCONTINUED] diltiazem (CARDIZEM CD) 360 MG 24 hr capsule TAKE 1 CAPSULE BY MOUTH ONCE DAILY  30 capsule  3   No current facility-administered medications for this visit.    Allergies  Review of patient's allergies indicates no known allergies.  Electrocardiogram:  afib rate 87  Otherwise normal   10/23/12  SR rate  54 normal   Assessment and Plan

## 2013-06-13 NOTE — Assessment & Plan Note (Signed)
Mild by echo in [address]  No murmur on exam  F/u echo as it relates to afib

## 2013-06-13 NOTE — Assessment & Plan Note (Signed)
Long discussion about natural history , rate control and anticoagulation  Start Eliquis 5bid check labs today F/U echo to assess degree of MR and atrial size Event monitor to make sure this has been peristant and  Not paroxysmal afib.  F/U 4 weeks to discuss [facility] at that time

## 2013-06-13 NOTE — Patient Instructions (Signed)
Your physician recommends that you schedule a follow-up appointment in:  NEXT  AVAILABLE WITH DR [facility] Your physician has recommended you make the following change in your medication: START ELIQUIS   5 MG  [place] physician has requested that you have an echocardiogram. Echocardiography is a painless test that uses sound waves to create images of your heart. It provides your doctor with information about the size and shape of your heart and how well your heart's chambers and valves are working. This procedure takes approximately one hour. There are no restrictions for this procedure.  Your physician has recommended that you wear an event monitor. Event monitors are medical devices that record the heart's electrical activity. Doctors most often [place] these monitors to diagnose arrhythmias. Arrhythmias are problems with the speed or rhythm of the heartbeat. The monitor is a small, portable device. You can wear one while you do your normal daily activities. This is usually used to diagnose what is causing palpitations/syncope (passing out).  Your physician recommends that you return for lab work in:  [place]

## 2013-06-14 ENCOUNTER — Other Ambulatory Visit: Payer: Self-pay | Admitting: Cardiovascular Disease

## 2013-06-14 LAB — CBC WITH DIFFERENTIAL/PLATELET
Basophils Absolute: 0.1 10*3/uL (ref 0.0–0.1)
Basophils Relative: 1 % (ref 0–1)
Eosinophils Absolute: 0.2 10*3/uL (ref 0.0–0.7)
Eosinophils Relative: 4 % (ref 0–5)
HCT: 43.9 % (ref 39.0–52.0)
Hemoglobin: 15.1 g/dL (ref 13.0–17.0)
Lymphocytes Relative: 42 % (ref 12–46)
Lymphs Abs: 2.2 10*3/uL (ref 0.7–4.0)
MCH: 31.4 pg (ref 26.0–34.0)
MCHC: 34.4 g/dL (ref 30.0–36.0)
MCV: 91.3 fL (ref 78.0–100.0)
Monocytes Absolute: 0.4 10*3/uL (ref 0.1–1.0)
Monocytes Relative: 7 % (ref 3–12)
Neutro Abs: 2.4 10*3/uL (ref 1.7–7.7)
Neutrophils Relative %: 46 % (ref 43–77)
Platelets: 253 10*3/uL (ref 150–400)
RBC: 4.81 MIL/uL (ref 4.22–5.81)
RDW: 12.8 % (ref 11.5–15.5)
WBC: 5.3 10*3/uL (ref 4.0–10.5)

## 2013-06-16 ENCOUNTER — Telehealth: Payer: Self-pay

## 2013-06-16 NOTE — Telephone Encounter (Signed)
error 

## 2013-06-19 ENCOUNTER — Ambulatory Visit (HOSPITAL_COMMUNITY)
Admission: RE | Admit: 2013-06-19 | Discharge: 2013-06-19 | Disposition: A | Discharge status: Home / Self Care | Payer: BC Managed Care – PPO | Source: Ambulatory Visit | Attending: Cardiovascular Disease | Admitting: Cardiovascular Disease

## 2013-06-19 DIAGNOSIS — I4891 Unspecified atrial fibrillation: Secondary | ICD-10-CM | POA: Insufficient documentation

## 2013-06-19 DIAGNOSIS — I517 Cardiomegaly: Secondary | ICD-10-CM

## 2013-06-19 NOTE — Progress Notes (Signed)
  Echocardiogram 2D Echocardiogram has been performed.  Basilia Jumbo 06/19/2013, 8:41 AM

## 2013-06-20 ENCOUNTER — Encounter: Payer: Self-pay | Admitting: Radiology

## 2013-06-20 ENCOUNTER — Encounter (INDEPENDENT_AMBULATORY_CARE_PROVIDER_SITE_OTHER): Payer: BC Managed Care – PPO

## 2013-06-20 DIAGNOSIS — I4891 Unspecified atrial fibrillation: Secondary | ICD-10-CM

## 2013-06-20 NOTE — Progress Notes (Signed)
Patient ID: Alan Thompson, male   DOB: 1965-12-13, 48 y.o.   MRN: [id number] E cardio 30 day monitor applied

## 2013-07-03 ENCOUNTER — Ambulatory Visit (INDEPENDENT_AMBULATORY_CARE_PROVIDER_SITE_OTHER): Payer: BC Managed Care – PPO | Admitting: Cardiovascular Disease

## 2013-07-03 ENCOUNTER — Encounter: Payer: Self-pay | Admitting: *Deleted

## 2013-07-03 ENCOUNTER — Encounter: Payer: Self-pay | Admitting: Cardiovascular Disease

## 2013-07-03 VITALS — BP 110/72 | HR 64 | Ht 70.0 in | Wt 249.0 lb

## 2013-07-03 DIAGNOSIS — I4891 Unspecified atrial fibrillation: Secondary | ICD-10-CM

## 2013-07-03 DIAGNOSIS — I340 Nonrheumatic mitral (valve) insufficiency: Secondary | ICD-10-CM

## 2013-07-03 DIAGNOSIS — I059 Rheumatic mitral valve disease, unspecified: Secondary | ICD-10-CM

## 2013-07-03 DIAGNOSIS — Z01810 Encounter for preprocedural cardiovascular examination: Secondary | ICD-10-CM

## 2013-07-03 LAB — BASIC METABOLIC PANEL
BUN: 13 mg/dL (ref 6–23)
CO2: 24 mEq/L (ref 19–32)
Calcium: 9.1 mg/dL (ref 8.4–10.5)
Chloride: 104 mEq/L (ref 96–112)
Creatinine, Ser: 1.2 mg/dL (ref 0.4–1.5)
GFR: 67.94 mL/min (ref >60.00)
Glucose, Bld: 85 mg/dL (ref 70–99)
Potassium: 3.9 mEq/L (ref 3.5–5.1)
Sodium: 136 mEq/L (ref 135–145)

## 2013-07-03 LAB — CBC WITH DIFFERENTIAL/PLATELET
Basophils Absolute: 0 10*3/uL (ref 0.0–0.1)
Basophils Relative: 0.6 % (ref 0.0–3.0)
Eosinophils Absolute: 0.2 10*3/uL (ref 0.0–0.7)
Eosinophils Relative: 3.4 % (ref 0.0–5.0)
HCT: 45.2 % (ref 39.0–52.0)
Hemoglobin: 15.4 g/dL (ref 13.0–17.0)
Lymphocytes Relative: 36.4 % (ref 12.0–46.0)
Lymphs Abs: 2.1 10*3/uL (ref 0.7–4.0)
MCHC: 34.1 g/dL (ref 30.0–36.0)
MCV: 93.6 fl (ref 78.0–100.0)
Monocytes Absolute: 0.5 10*3/uL (ref 0.1–1.0)
Monocytes Relative: 8.8 % (ref 3.0–12.0)
Neutro Abs: 3 10*3/uL (ref 1.4–7.7)
Neutrophils Relative %: 50.8 % (ref 43.0–77.0)
Platelets: 290 10*3/uL (ref 150.0–400.0)
RBC: 4.83 Mil/uL (ref 4.22–5.81)
RDW: 12.8 % (ref 11.5–14.6)
WBC: 5.8 10*3/uL (ref 4.5–10.5)

## 2013-07-03 NOTE — Assessment & Plan Note (Signed)
Trivial by last echo No need for SBE

## 2013-07-03 NOTE — Patient Instructions (Signed)
Your physician has recommended that you have a Cardioversion (DCCV). Electrical Cardioversion uses a jolt of electricity to your heart either through paddles or wired patches attached to your chest. This is a controlled, usually prescheduled, procedure. Defibrillation is done under light anesthesia in the hospital, and you usually go home the day of the procedure. This is done to get your heart back into a normal rhythm. You are not awake for the procedure. Please see the instruction sheet given to you today. Your physician recommends that you return for lab work in:  [place] recommends that you continue on your current medications as directed. Please refer to the Current Medication list given to you today.

## 2013-07-03 NOTE — Assessment & Plan Note (Signed)
Continue Eliquis  [place] next Tuesday with Dr Meda Coffee  Risks discussed willing to proceed.   Orders written and [facility] called

## 2013-07-03 NOTE — Progress Notes (Signed)
Patient ID: Alan Thompson, male   DOB: 11/11/1965, 48 y.o.   MRN: [id number] Mr. Alan Thompson returns today for evaluation and management of a history of paroxysmal atrial fibrillation. Other than mild mitral regurgitation, abnormal echocardiogram when this was first diagnosed. We have treated with diltiazem and aspirin. Last seen by Dr Wall 2014 New to me  His blood work is followed by Dr.LaLonde. He states his blood pressures been under good control.  Started feeling like he was back in afib in September. Called office but no immediate appt made. He had trip planned to [place] and was hoping things would return to normal so didn't push for sooner appt. Has had more exertional dyspnea fatigue since being in afib. No chest pain. No contraindications to anticoagulation. Does not want to take coumadin   Echo 3/15 Study Conclusions  - Left ventricle: The cavity size was normal. Systolic function was normal. The estimated ejection fraction was in the range of 60% to 65%. Wall motion was normal; there were no regional wall motion abnormalities. - Left atrium: The atrium was mildly dilated. - Atrial septum: No defect or patent foramen ovale was identified.  Event monitor shows persistant afib Compliant with eliquis Will set up DCC     ROS: Denies fever, malais, weight loss, blurry vision, decreased visual acuity, cough, sputum, SOB, hemoptysis, pleuritic pain, palpitaitons, heartburn, abdominal pain, melena, lower extremity edema, claudication, or rash.  All other systems reviewed and negative  General: Affect appropriate Healthy:  appears stated age HEENT: normal Neck supple with no adenopathy JVP normal no bruits no thyromegaly Lungs clear with no wheezing and good diaphragmatic motion Heart:  S1/S2 no murmur, no rub, gallop or click PMI normal Abdomen: benighn, BS positve, no tenderness, no AAA no bruit.  No HSM or HJR Distal pulses intact with no bruits No edema Neuro non-focal Skin warm  and dry No muscular weakness   Current Outpatient Prescriptions  Medication Sig Dispense Refill  . apixaban (ELIQUIS) 5 MG TABS tablet Take 1 tablet (5 mg total) by mouth 2 (two) times daily.  180 tablet  3  . diltiazem (TIAZAC) 360 MG 24 hr capsule TAKE 1 CAPSULE BY MOUTH ONCE DAILY  30 capsule  6  . fluticasone (FLONASE) 50 MCG/ACT nasal spray Place 2 sprays into the nose as needed.  48 g  3  . mometasone (ASMANEX) 220 MCG/INH inhaler Inhale 2 puffs into the lungs daily.  3 Inhaler  3  . omeprazole (PRILOSEC) 40 MG capsule Take 1 capsule (40 mg total) by mouth daily.  90 capsule  3  . [DISCONTINUED] diltiazem (CARDIZEM CD) 360 MG 24 hr capsule TAKE 1 CAPSULE BY MOUTH ONCE DAILY  30 capsule  3   No current facility-administered medications for this visit.    Allergies  Review of patient's allergies indicates no known allergies.  Electrocardiogram:  afib rate 87 Otherwise normal 10/23/12 SR rate 54 normal    Assessment and Plan  

## 2013-07-08 ENCOUNTER — Encounter (HOSPITAL_COMMUNITY): Payer: BC Managed Care – PPO | Admitting: Anesthesiology

## 2013-07-08 ENCOUNTER — Ambulatory Visit (HOSPITAL_COMMUNITY): Payer: BC Managed Care – PPO | Admitting: Anesthesiology

## 2013-07-08 ENCOUNTER — Encounter (HOSPITAL_COMMUNITY): Payer: Self-pay | Admitting: Gastroenterology

## 2013-07-08 ENCOUNTER — Ambulatory Visit (HOSPITAL_COMMUNITY)
Admission: RE | Admit: 2013-07-08 | Discharge: 2013-07-08 | Disposition: A | Discharge status: Home / Self Care | Payer: BC Managed Care – PPO | Source: Ambulatory Visit | Attending: Cardiology | Admitting: Cardiology

## 2013-07-08 ENCOUNTER — Encounter (HOSPITAL_COMMUNITY)
Admission: RE | Disposition: A | Discharge status: Home / Self Care | Payer: Self-pay | Source: Ambulatory Visit | Attending: Cardiology

## 2013-07-08 DIAGNOSIS — Z87891 Personal history of nicotine dependence: Secondary | ICD-10-CM | POA: Insufficient documentation

## 2013-07-08 DIAGNOSIS — Z7901 Long term (current) use of anticoagulants: Secondary | ICD-10-CM | POA: Insufficient documentation

## 2013-07-08 DIAGNOSIS — K449 Diaphragmatic hernia without obstruction or gangrene: Secondary | ICD-10-CM | POA: Insufficient documentation

## 2013-07-08 DIAGNOSIS — K219 Gastro-esophageal reflux disease without esophagitis: Secondary | ICD-10-CM | POA: Insufficient documentation

## 2013-07-08 DIAGNOSIS — I4891 Unspecified atrial fibrillation: Secondary | ICD-10-CM

## 2013-07-08 DIAGNOSIS — I059 Rheumatic mitral valve disease, unspecified: Secondary | ICD-10-CM | POA: Insufficient documentation

## 2013-07-08 DIAGNOSIS — J45909 Unspecified asthma, uncomplicated: Secondary | ICD-10-CM | POA: Insufficient documentation

## 2013-07-08 HISTORY — DX: Cardiac arrhythmia, unspecified: I49.9

## 2013-07-08 HISTORY — PX: CARDIOVERSION: SHX1299

## 2013-07-08 HISTORY — DX: Adverse effect of unspecified anesthetic, initial encounter: T41.45XA

## 2013-07-08 SURGERY — CARDIOVERSION
Anesthesia: Monitor Anesthesia Care

## 2013-07-08 MED ORDER — SODIUM CHLORIDE 0.9 % IV SOLN
INTRAVENOUS | Status: DC | PRN
  Administered 2013-07-08: 11:00:00 via INTRAVENOUS

## 2013-07-08 MED ORDER — LIDOCAINE HCL (CARDIAC) 20 MG/ML IV SOLN
INTRAVENOUS | Status: DC | PRN
  Administered 2013-07-08: 40 mg via INTRAVENOUS

## 2013-07-08 MED ORDER — PROPOFOL 10 MG/ML IV BOLUS
INTRAVENOUS | Status: DC | PRN
  Administered 2013-07-08: 150 mg via INTRAVENOUS

## 2013-07-08 MED ORDER — SODIUM CHLORIDE 0.9 % IV SOLN
INTRAVENOUS | Status: DC
Start: 2013-07-08 — End: 2013-07-08

## 2013-07-08 NOTE — CV Procedure (Signed)
   Cardioversion Note  Alan Thompson [id number] [birth year]  Procedure: DC Cardioversion Indications: atrial fibrillation  Procedure Details Consent: Obtained Time Out: Verified patient identification, verified procedure, site/side was marked, verified correct patient position, special equipment/implants available, Radiology Safety Procedures followed,  medications/allergies/relevent history reviewed, required imaging and test results available.  Performed  The patient has been on adequate anticoagulation.  The patient received IV Propofol and Lidocain by anesthesia staff for sedation.  Synchronous cardioversion was performed at 150 joules.  The cardioversion was successful   Complications: No apparent complications Patient did tolerate procedure well.   [name], MD, [facility] 07/08/2013, 11:04 AM

## 2013-07-08 NOTE — Discharge Instructions (Signed)
Electrical Cardioversion °Electrical cardioversion is the delivery of a jolt of electricity to change the rhythm of the heart. Sticky patches or metal paddles are placed on the chest to deliver the electricity from a device. This is done to restore a normal rhythm. A rhythm that is too fast or not regular keeps the heart from pumping well. °Electrical cardioversion is done in an emergency if:  °· There is low or no blood pressure as a result of the heart rhythm.   °· Normal rhythm must be restored as fast as possible to protect the brain and heart from further damage.   °· It may save a life. °Cardioversion may be done for heart rhythms that are not immediately life-threatening, such as atrial fibrillation or flutter, in which:  °· The heart is beating too fast or is not regular.   °· Medicine to change the rhythm has not worked.   °· It is safe to wait in order to allow time for preparation. °· Symptoms of the abnormal rhythm are bothersome. °· The risk of stroke and other serious complications can be reduced. °LET YOUR CAREGIVER KNOW ABOUT:  °· All medicines you are taking, including vitamins, herbs, eye drops, creams, and over-the-counter medicines.   °· Previous problems you or members of your family have had with the use of anesthetics.   °· Any blood disorders you have.   °· Previous surgeries you have had.   °· Medical conditions you have. °RISKS AND COMPLICATIONS  °Generally, this is a safe procedure. However, as with any procedure, complications can occur. Possible complications include:  °· Breathing problems related to the anesthetic used. °· Cardiac arrest This risk is rare. °· A blood clot that breaks free and travels to other parts of your body. This could cause a stroke or other problems. The risk of this is lowered by use of blood thinning medicine (anticoagulant) prior to the procedure. °BEFORE THE PROCEDURE  °· You may have tests to detect blood clots in your heart and evaluate heart  function.  °· You may start taking anticoagulants so your blood does not clot as easily.   °· Medicines may be given to help stabilize your heart rate and rhythm. °PROCEDURE °· You will be given medicine through an IV tube to reduce discomfort and make you sleepy (sedative).   °· An electrical shock will be delivered. °AFTER THE PROCEDURE °Your heart rhythm will be watched to make sure it does not change. You may be able to go home within a few hours.  °Document Released: 03/17/2002 Document Revised: 01/15/2013 Document Reviewed: 10/09/2012 °[employer]® Patient Information ©2014 [employer]. ° °

## 2013-07-08 NOTE — Transfer of Care (Signed)
Immediate Anesthesia Transfer of Care Note  Patient: Alan Thompson  Procedure(s) Performed: Procedure(s): CARDIOVERSION (N/A)  Patient Location: PACU and Endoscopy Unit  Anesthesia Type:MAC  Level of Consciousness: awake, alert  and lethargic  Airway & Oxygen Therapy: Patient Spontanous Breathing  Post-op Assessment: Report given to PACU RN and Post -op Vital signs reviewed and stable  Post vital signs: Reviewed and stable  Complications: No apparent anesthesia complications

## 2013-07-08 NOTE — H&P (View-Only) (Signed)
Patient ID: Alan Thompson, male   DOB: [birth year], 48 y.o.   MRN: [id number] Mr. Covin returns today for evaluation and management of a history of paroxysmal atrial fibrillation. Other than mild mitral regurgitation, abnormal echocardiogram when this was first diagnosed. We have treated with diltiazem and aspirin. Last seen by Dr Verl Blalock 2014 New to me  His blood work is followed by Dr.LaLonde. He states his blood pressures been under good control.  Started feeling like he was back in afib in September. Called office but no immediate appt made. He had trip planned to [facility] and was hoping things would return to normal so didn't push for sooner appt. Has had more exertional dyspnea fatigue since being in afib. No chest pain. No contraindications to anticoagulation. Does not want to take coumadin   Echo 3/15 Study Conclusions  - Left ventricle: The cavity size was normal. Systolic function was normal. The estimated ejection fraction was in the range of 60% to 65%. Wall motion was normal; there were no regional wall motion abnormalities. - Left atrium: The atrium was mildly dilated. - Atrial septum: No defect or patent foramen ovale was identified.  Event monitor shows persistant afib Compliant with eliquis Will set up [facility]     ROS: Denies fever, malais, weight loss, blurry vision, decreased visual acuity, cough, sputum, SOB, hemoptysis, pleuritic pain, palpitaitons, heartburn, abdominal pain, melena, lower extremity edema, claudication, or rash.  All other systems reviewed and negative  General: Affect appropriate Healthy:  appears stated age 39: normal Neck supple with no adenopathy JVP normal no bruits no thyromegaly Lungs clear with no wheezing and good diaphragmatic motion Heart:  S1/S2 no murmur, no rub, gallop or click PMI normal Abdomen: benighn, BS positve, no tenderness, no AAA no bruit.  No HSM or HJR Distal pulses intact with no bruits No edema Neuro non-focal Skin warm  and dry No muscular weakness   Current Outpatient Prescriptions  Medication Sig Dispense Refill  . apixaban (ELIQUIS) 5 MG TABS tablet Take 1 tablet (5 mg total) by mouth 2 (two) times daily.  180 tablet  3  . diltiazem (TIAZAC) 360 MG 24 hr capsule TAKE 1 CAPSULE BY MOUTH ONCE DAILY  30 capsule  6  . fluticasone (FLONASE) 50 MCG/ACT nasal spray Place 2 sprays into the nose as needed.  48 g  3  . mometasone (ASMANEX) 220 MCG/INH inhaler Inhale 2 puffs into the lungs daily.  3 Inhaler  3  . omeprazole (PRILOSEC) 40 MG capsule Take 1 capsule (40 mg total) by mouth daily.  90 capsule  3  . [DISCONTINUED] diltiazem (CARDIZEM CD) 360 MG 24 hr capsule TAKE 1 CAPSULE BY MOUTH ONCE DAILY  30 capsule  3   No current facility-administered medications for this visit.    Allergies  Review of patient's allergies indicates no known allergies.  Electrocardiogram:  afib rate 87 Otherwise normal 10/23/12 SR rate 54 normal    Assessment and Plan

## 2013-07-08 NOTE — Preoperative (Signed)
Beta Blockers   Reason not to administer Beta Blockers:Not Applicable 

## 2013-07-08 NOTE — Anesthesia Preprocedure Evaluation (Addendum)
Anesthesia Evaluation  Patient identified by MRN, date of birth, ID band Patient awake    Reviewed: Allergy & Precautions, H&P , NPO status , Patient's Chart, lab work & pertinent test results, reviewed documented beta blocker date and time   Airway Mallampati: III TM Distance: <3 FB Neck ROM: Full    Dental  (+) Teeth Intact, Dental Advisory Given   Pulmonary asthma , former smoker,  breath sounds clear to auscultation        Cardiovascular + dysrhythmias Atrial Fibrillation + Valvular Problems/Murmurs MR Rhythm:Irregular Rate:Abnormal     Neuro/Psych    GI/Hepatic hiatal hernia, GERD-  Medicated and Controlled,  Endo/Other    Renal/GU      Musculoskeletal   Abdominal   Peds  Hematology   Anesthesia Other Findings   Reproductive/Obstetrics                          Anesthesia Physical Anesthesia Plan  ASA: II  Anesthesia Plan: MAC   Post-op Pain Management:    Induction: Intravenous  Airway Management Planned: Mask and Natural Airway  Additional Equipment:   Intra-op Plan:   Post-operative Plan:   Informed Consent: I have reviewed the patients History and Physical, chart, labs and discussed the procedure including the risks, benefits and alternatives for the proposed anesthesia with the patient or authorized representative who has indicated his/her understanding and acceptance.   Dental advisory given  Plan Discussed with: CRNA, Anesthesiologist and Surgeon  Anesthesia Plan Comments:         Anesthesia Quick Evaluation

## 2013-07-08 NOTE — Anesthesia Postprocedure Evaluation (Signed)
  Anesthesia Post-op Note  Patient: Alan Thompson  Procedure(s) Performed: Procedure(s): CARDIOVERSION (N/A)  Patient Location: PACU  Anesthesia Type:General  Level of Consciousness: awake, alert  and oriented  Airway and Oxygen Therapy: Patient Spontanous Breathing  Post-op Pain: mild  Post-op Assessment: Post-op Vital signs reviewed  Post-op Vital Signs: Reviewed  Complications: No apparent anesthesia complications

## 2013-07-08 NOTE — Interval H&P Note (Signed)
History and Physical Interval Note:  07/08/2013 11:03 AM  Alan Thompson  has presented today for surgery, with the diagnosis of AFIB  The various methods of treatment have been discussed with the patient and family. After consideration of risks, benefits and other options for treatment, the patient has consented to  Procedure(s): CARDIOVERSION (N/A) as a surgical intervention .  The patient's history has been reviewed, patient examined, no change in status, stable for surgery.  I have reviewed the patient's chart and labs.  Questions were answered to the patient's satisfaction.     [name]

## 2013-07-09 ENCOUNTER — Encounter (HOSPITAL_COMMUNITY): Payer: Self-pay | Admitting: Cardiology

## 2013-07-10 ENCOUNTER — Encounter: Payer: Self-pay | Admitting: Cardiovascular Disease

## 2013-07-14 ENCOUNTER — Other Ambulatory Visit: Payer: Self-pay | Admitting: *Deleted

## 2013-07-18 ENCOUNTER — Ambulatory Visit: Payer: BC Managed Care – PPO | Admitting: Cardiovascular Disease

## 2013-07-21 ENCOUNTER — Telehealth: Payer: Self-pay | Admitting: Cardiovascular Disease

## 2013-07-21 DIAGNOSIS — I4891 Unspecified atrial fibrillation: Secondary | ICD-10-CM

## 2013-07-21 MED ORDER — FLECAINIDE ACETATE 50 MG PO TABS: 50.0000 mg | ORAL_TABLET | Freq: Two times a day (BID) | ORAL | Status: DC

## 2013-07-21 NOTE — Telephone Encounter (Signed)
Spoke with pt-pt states he had a cardioversion 07/09/13.  He went back in to at [facility] 07/16/13. Pt states heart rate is in the 80s and he has some fatigue but no other symptoms. He has appt with Dr Johnsie Cancel 4/27. Pt asking if he should do anything different between now and time of the appt with Dr Johnsie Cancel 08/04/13. Will forward to Dr Johnsie Cancel for review.

## 2013-07-21 NOTE — Telephone Encounter (Signed)
Start flecainide 50 bid  F/U ETT in 7-10 days to r/o proarrhythmia and keep appt with me  Make sure his is taking his eliquis bid

## 2013-07-21 NOTE — Telephone Encounter (Signed)
New problem        Pt had cardio version 4/1 but now is in A fid 4/8.  Pt would like to see Dr soon but made and a appt for next available 4/27.   Pt would like to know and have an appt made sooner.

## 2013-07-21 NOTE — Telephone Encounter (Signed)
Pt advised,verbalized understanding. 

## 2013-07-21 NOTE — Telephone Encounter (Signed)
Instructions for ETT over the telephone, message to [facility] to call and schedule in 7-10 days per Dr Johnsie Cancel.

## 2013-07-30 ENCOUNTER — Encounter (HOSPITAL_COMMUNITY): Payer: BC Managed Care – PPO

## 2013-08-01 ENCOUNTER — Other Ambulatory Visit: Payer: Self-pay

## 2013-08-01 ENCOUNTER — Ambulatory Visit (HOSPITAL_COMMUNITY)
Admission: RE | Admit: 2013-08-01 | Discharge: 2013-08-01 | Disposition: A | Discharge status: Home / Self Care | Payer: BC Managed Care – PPO | Source: Ambulatory Visit | Attending: Cardiology | Admitting: Cardiology

## 2013-08-01 DIAGNOSIS — I4891 Unspecified atrial fibrillation: Secondary | ICD-10-CM

## 2013-08-04 ENCOUNTER — Encounter: Payer: Self-pay | Admitting: Cardiovascular Disease

## 2013-08-04 ENCOUNTER — Ambulatory Visit (INDEPENDENT_AMBULATORY_CARE_PROVIDER_SITE_OTHER): Payer: BC Managed Care – PPO | Admitting: Cardiovascular Disease

## 2013-08-04 VITALS — BP 138/91 | HR 61 | Ht 70.0 in | Wt 249.0 lb

## 2013-08-04 DIAGNOSIS — I059 Rheumatic mitral valve disease, unspecified: Secondary | ICD-10-CM

## 2013-08-04 DIAGNOSIS — I4891 Unspecified atrial fibrillation: Secondary | ICD-10-CM

## 2013-08-04 DIAGNOSIS — I340 Nonrheumatic mitral (valve) insufficiency: Secondary | ICD-10-CM

## 2013-08-04 NOTE — Assessment & Plan Note (Signed)
Maint NSR on flecainide  Continue 50 bid with eliquis and cardizem  F/U 3 months

## 2013-08-04 NOTE — Patient Instructions (Signed)
Your physician recommends that you schedule a follow-up appointment in:  3 MONTHS WITH  DR NISHAN  Your physician recommends that you continue on your current medications as directed. Please refer to the Current Medication list given to you today.  

## 2013-08-04 NOTE — Progress Notes (Signed)
Patient ID: Alan Thompson, male   DOB: August 01, 1965, 48 y.o.   MRN: [id number] Alan [place] returns today for evaluation and management of a history of paroxysmal atrial fibrillation. Other than mild mitral regurgitation, abnormal echocardiogram when this was first diagnosed. We have treated with diltiazem and aspirin. Last seen by Alan Thompson 2014 New to me  His blood work is followed by Alan [place]. He states his blood pressures been under good control.  Started feeling like he was back in afib in September. Called office but no immediate appt made. He had trip planned to [place] and was hoping things would return to normal so didn't push for sooner appt. Has had more exertional dyspnea fatigue since being in afib. No chest pain. No contraindications to anticoagulation. Does not want to take coumadin  Echo 3/15  Study Conclusions  - Left ventricle: The cavity size was normal. Systolic function was normal. The estimated ejection fraction was in the range of 60% to 65%. Wall motion was normal; there were no regional wall motion abnormalities. - Left atrium: The atrium was mildly dilated. - Atrial septum: No defect or patent foramen ovale was identified.   Event monitor shows persistant afib Compliant with eliquis   3/31 had [place] with single 150J shock converted to NSR  Reverted to afib and started on flecainide  ETT done 4/24 with no proarrhythmia or ischemia  ECG 4/24 during stress test normal in sinus  Doing well with no bleeding issues    ROS: Denies fever, malais, weight loss, blurry vision, decreased visual acuity, cough, sputum, SOB, hemoptysis, pleuritic pain, palpitaitons, heartburn, abdominal pain, melena, lower extremity edema, claudication, or rash.  All other systems reviewed and negative  General: Affect appropriate Healthy:  appears stated age 48: normal Neck supple with no adenopathy JVP normal no bruits no thyromegaly Lungs clear with no wheezing and good diaphragmatic  motion Heart:  S1/S2 no murmur, no rub, gallop or click PMI normal Abdomen: benighn, BS positve, no tenderness, no AAA no bruit.  No HSM or HJR Distal pulses intact with no bruits No edema Neuro non-focal Skin warm and dry No muscular weakness   Current Outpatient Prescriptions  Medication Sig Dispense Refill  . apixaban (ELIQUIS) 5 MG TABS tablet Take 1 tablet (5 mg total) by mouth 2 (two) times daily.  180 tablet  3  . cetirizine (ZYRTEC) 10 MG tablet Take 10 mg by mouth daily.      Marland Kitchen diltiazem (TIAZAC) 360 MG 24 hr capsule TAKE 1 CAPSULE BY MOUTH ONCE DAILY  30 capsule  6  . flecainide (TAMBOCOR) 50 MG tablet Take 1 tablet (50 mg total) by mouth 2 (two) times daily.  60 tablet  0  . fluticasone (FLONASE) 50 MCG/ACT nasal spray Place 2 sprays into the nose as needed.  48 g  3  . mometasone (ASMANEX) 220 MCG/INH inhaler Inhale 2 puffs into the lungs daily.  3 Inhaler  3  . omeprazole (PRILOSEC) 40 MG capsule Take 1 capsule (40 mg total) by mouth daily.  90 capsule  3  . [DISCONTINUED] diltiazem (CARDIZEM CD) 360 MG 24 hr capsule TAKE 1 CAPSULE BY MOUTH ONCE DAILY  30 capsule  3   No current facility-administered medications for this visit.    Allergies  Review of patient's allergies indicates no known allergies.  Electrocardiogram:  NSR normal ECG   Assessment and Plan

## 2013-08-04 NOTE — Assessment & Plan Note (Signed)
Mild f/u echo in a year hopefully will not be worse if he maintains NSR

## 2013-08-22 ENCOUNTER — Other Ambulatory Visit: Payer: Self-pay | Admitting: *Deleted

## 2013-08-22 DIAGNOSIS — I4891 Unspecified atrial fibrillation: Secondary | ICD-10-CM

## 2013-08-22 MED ORDER — FLECAINIDE ACETATE 50 MG PO TABS: 50.0000 mg | ORAL_TABLET | Freq: Two times a day (BID) | ORAL | Status: DC

## 2013-09-03 ENCOUNTER — Telehealth: Payer: Self-pay | Admitting: *Deleted

## 2013-09-03 NOTE — Telephone Encounter (Signed)
LEFT MESSAGE   FOR  CALL BACK PT IS  DUE  FOR   AN  APPT  WITH  DR  Johnsie Cancel   NEXT  AVAILABLE  SHOULD  HAVE  BEEN  SEEN  IN  June

## 2013-12-02 ENCOUNTER — Other Ambulatory Visit: Payer: Self-pay

## 2013-12-02 MED ORDER — FLECAINIDE ACETATE 50 MG PO TABS: 50.0000 mg | ORAL_TABLET | Freq: Two times a day (BID) | ORAL | Status: DC

## 2013-12-30 ENCOUNTER — Other Ambulatory Visit: Payer: Self-pay

## 2013-12-30 MED ORDER — APIXABAN 5 MG PO TABS
5.0000 mg | ORAL_TABLET | Freq: Two times a day (BID) | ORAL | Status: DC
Start: 2013-12-30 — End: 2014-04-15

## 2014-01-05 ENCOUNTER — Ambulatory Visit: Payer: BC Managed Care – PPO | Admitting: Cardiovascular Disease

## 2014-01-13 ENCOUNTER — Telehealth: Payer: Self-pay | Admitting: Family Medicine

## 2014-01-13 MED ORDER — CLINDAMYCIN PHOSPHATE 1 % EX LOTN: TOPICAL_LOTION | Freq: Two times a day (BID) | CUTANEOUS | Status: DC

## 2014-01-13 NOTE — Telephone Encounter (Signed)
Requesting refill on Clindamycin Phosphate Topical Solution in a roll on dispenser bottle.. Pt says he was given a different med more recently but this is the med he used to have and it worked better for him.

## 2014-01-26 ENCOUNTER — Ambulatory Visit (INDEPENDENT_AMBULATORY_CARE_PROVIDER_SITE_OTHER): Payer: BC Managed Care – PPO | Admitting: Cardiovascular Disease

## 2014-01-26 ENCOUNTER — Encounter: Payer: Self-pay | Admitting: Cardiovascular Disease

## 2014-01-26 VITALS — BP 124/80 | HR 83 | Ht 70.0 in | Wt 247.1 lb

## 2014-01-26 DIAGNOSIS — I4891 Unspecified atrial fibrillation: Secondary | ICD-10-CM

## 2014-01-26 DIAGNOSIS — I340 Nonrheumatic mitral (valve) insufficiency: Secondary | ICD-10-CM

## 2014-01-26 DIAGNOSIS — Z7901 Long term (current) use of anticoagulants: Secondary | ICD-10-CM

## 2014-01-26 NOTE — Patient Instructions (Addendum)
Your physician recommends that you schedule a follow-up appointment in: NEXT AVAILABLE WITH EP  OR  AFIB  CLINIC AND  6 MONTHS WITH  DR Johnsie Cancel Your physician recommends that you continue on your current medications as directed. Please refer to the Current Medication list given to you today. Your physician recommends that you return for lab work in:  [place] BMET   DX  AFIB

## 2014-01-26 NOTE — Assessment & Plan Note (Signed)
Mild on echo no murmur on exam consider repeating pending EP recommendations

## 2014-01-26 NOTE — Progress Notes (Signed)
Patient ID: Alan Thompson, male   DOB: 05-12-1965, 48 y.o.   MRN: [id number] Mr. Platten returns today for evaluation and management of a history of paroxysmal atrial fibrillation. Other than mild mitral regurgitation, abnormal echocardiogram when this was first diagnosed. We have treated with diltiazem and aspirin. Last seen by Dr Verl Blalock 2014 New to me  His blood work is followed by Dr.LaLonde. He states his blood pressures been under good control.  Started feeling like he was back in afib in September. Called office but no immediate appt made. He had trip planned to [facility] and was hoping things would return to normal so didn't push for sooner appt. Has had more exertional dyspnea fatigue since being in afib. No chest pain. No contraindications to anticoagulation. Does not want to take coumadin  Echo 3/15  Study Conclusions  - Left ventricle: The cavity size was normal. Systolic function was normal. The estimated ejection fraction was in the range of 60% to 65%. Wall motion was normal; there were no regional wall motion abnormalities. - Left atrium: The atrium was mildly dilated. - Atrial septum: No defect or patent foramen ovale was identified.   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  Some dependant edema.  Some neuropathic pain left lateral femoral cutaneious  Area likely related to previous left TKR   In office today was back in afib.  He is unaware.  Discussed options with him  Can consider alternative antiarrythmic like tikosyn,  Ablation or rate control and anticoagulations Especially with his work schedule and travel he is not keen on hospitalization.  Also  discussed stopping flecainide if he is going to be rate controlled only.  He did not want to  Have event monitor to document persistence of afib     ROS: Denies fever, malais, weight loss, blurry vision, decreased visual acuity, cough, sputum, SOB, hemoptysis, pleuritic pain, palpitaitons, heartburn, abdominal pain, melena, lower extremity edema, claudication, or rash.  All other systems reviewed and negative  General: Affect appropriate Healthy:  appears stated age 33: normal Neck supple with no adenopathy JVP normal no bruits no thyromegaly Lungs clear with no wheezing and good diaphragmatic motion Heart:  S1/S2 no murmur, no rub, gallop or click PMI normal Abdomen: benighn, BS positve, no tenderness, no AAA no bruit.  No HSM or HJR Distal pulses intact with no bruits No edema Neuro non-focal Skin warm and dry No muscular weakness S/P left TKR    Current Outpatient Prescriptions  Medication Sig Dispense Refill  . apixaban (ELIQUIS) 5 MG TABS tablet Take 1 tablet (5 mg total) by mouth 2 (two) times daily.  60 tablet  0  . cetirizine (ZYRTEC) 10 MG tablet Take 10 mg by mouth daily.      . clindamycin (CLEOCIN T) 1 % lotion Apply topically 2 (two) times daily.  60 mL  11  . diltiazem (TIAZAC) 360 MG 24 hr capsule TAKE 1 CAPSULE BY MOUTH ONCE DAILY  30 capsule  6  . flecainide (TAMBOCOR) 50 MG tablet Take 1 tablet (50 mg total) by mouth 2 (two) times daily.  60 tablet  1  .  fluticasone (FLONASE) 50 MCG/ACT nasal spray Place 2 sprays into the nose as needed.  48 g  3  . mometasone (ASMANEX) 220 MCG/INH inhaler Inhale 2 puffs into the lungs daily.  3 Inhaler  3  . omeprazole (PRILOSEC) 40 MG capsule Take 1 capsule (40 mg total) by mouth daily.  90 capsule  3  . [DISCONTINUED] diltiazem (CARDIZEM CD) 360 MG 24 hr capsule TAKE 1 CAPSULE BY MOUTH ONCE DAILY  30 capsule  3   No current facility-administered medications for this visit.    Allergies  Review of patient's allergies  indicates no known allergies.  Electrocardiogram:  SR rate 58 normal  07/08/13 today  afib rate 63  QT 384    Assessment and Plan

## 2014-01-26 NOTE — Assessment & Plan Note (Signed)
Recurrent post [facility] and on flecainide  F/U EP and afib clinic.  He is young and I think ablation or alternative antiarrhythmic should be chosen.  Would like to do event monitor to see if it is paroxysmal or persistent but patient does not want to  He may elect to rate control and anticoagulate in which case we would stop flecainide.  Would need periodic echo to make sure EF stays normal.  If event monitor showed persistent afib I think ablation would be better if paroxysmal possibly higher dose flecainide or alternative drug.

## 2014-01-26 NOTE — Assessment & Plan Note (Signed)
Continue eliquis  CBC PLT and BMET today

## 2014-01-27 LAB — CBC WITH DIFFERENTIAL/PLATELET
Basophils Absolute: 0 10*3/uL (ref 0.0–0.1)
Basophils Relative: 0.4 % (ref 0.0–3.0)
Eosinophils Absolute: 0.2 10*3/uL (ref 0.0–0.7)
Eosinophils Relative: 3.2 % (ref 0.0–5.0)
HCT: 45 % (ref 39.0–52.0)
Hemoglobin: 15.1 g/dL (ref 13.0–17.0)
Lymphocytes Relative: 37.6 % (ref 12.0–46.0)
Lymphs Abs: 2.8 10*3/uL (ref 0.7–4.0)
MCHC: 33.6 g/dL (ref 30.0–36.0)
MCV: 94.6 fl (ref 78.0–100.0)
Monocytes Absolute: 0.4 10*3/uL (ref 0.1–1.0)
Monocytes Relative: 5.6 % (ref 3.0–12.0)
Neutro Abs: 4 10*3/uL (ref 1.4–7.7)
Neutrophils Relative %: 53.2 % (ref 43.0–77.0)
Platelets: 235 10*3/uL (ref 150.0–400.0)
RBC: 4.76 Mil/uL (ref 4.22–5.81)
RDW: 13 % (ref 11.5–15.5)
WBC: 7.5 10*3/uL (ref 4.0–10.5)

## 2014-01-27 LAB — BASIC METABOLIC PANEL
BUN: 20 mg/dL (ref 6–23)
CO2: 26 mEq/L (ref 19–32)
Calcium: 9.1 mg/dL (ref 8.4–10.5)
Chloride: 103 mEq/L (ref 96–112)
Creatinine, Ser: 1.1 mg/dL (ref 0.4–1.5)
GFR: 74.8 mL/min (ref >60.00)
Glucose, Bld: 106 mg/dL — ABNORMAL HIGH (ref 70–99)
Potassium: 4 mEq/L (ref 3.5–5.1)
Sodium: 137 mEq/L (ref 135–145)

## 2014-01-31 ENCOUNTER — Other Ambulatory Visit: Payer: Self-pay | Admitting: Cardiovascular Disease

## 2014-02-20 ENCOUNTER — Encounter: Payer: Self-pay | Admitting: Cardiology

## 2014-03-02 ENCOUNTER — Encounter: Payer: Self-pay | Admitting: Internal Medicine

## 2014-03-02 ENCOUNTER — Ambulatory Visit (INDEPENDENT_AMBULATORY_CARE_PROVIDER_SITE_OTHER): Payer: BC Managed Care – PPO | Admitting: Internal Medicine

## 2014-03-02 VITALS — BP 120/88 | HR 78 | Ht 70.0 in | Wt 247.8 lb

## 2014-03-02 DIAGNOSIS — I481 Persistent atrial fibrillation: Secondary | ICD-10-CM

## 2014-03-02 DIAGNOSIS — I4819 Other persistent atrial fibrillation: Secondary | ICD-10-CM

## 2014-03-02 MED ORDER — FLECAINIDE ACETATE 100 MG PO TABS: 100.0000 mg | ORAL_TABLET | Freq: Two times a day (BID) | ORAL | Status: DC

## 2014-03-02 NOTE — Patient Instructions (Signed)
Your physician recommends that you schedule a follow-up appointment in: 1 week for an EKG if still in afib will need to be set up for a Cardioversion and 4 weeks in the afib clinic at [facility] with Roderic Palau, NP  Your physician recommends that you return for lab work today Flecainide level  Your physician has recommended you make the following change in your medication:  1) Increase Flecainide to 100mg  twice daily  Your physician has recommended that you have a sleep study. This test records several body functions during sleep, including: brain activity, eye movement, oxygen and carbon dioxide blood levels, heart rate and rhythm, breathing rate and rhythm, the flow of air through your mouth and nose, snoring, body muscle movements, and chest and belly movement.

## 2014-03-02 NOTE — Progress Notes (Signed)
Primary Care Physician: Wyatt Haste, MD Primary Cardiologist:Dr.Nishan Primary Electrophysiologist:Dr. Brinna Divelbiss Referring Physician: Dr. Berline Chough [place] is a 48 y.o. male with a h/o  persistent atrial fibrillation who presents for consultation in the [facility].  The patient was initially diagnosed with PAF in 2010 followed by Dr. Verl Blalock, initially asymptomatic.Was treated with asa and diltiazem initially. Placed on Eliquis when Dr. Johnsie Cancel resumed care from Dr. Verl Blalock 06/13/13.He had DCCV 07/08/13 with ERAF, holding only 1-2 weeks.Flecainide 50 bid was started 07/21/13. No other antiarrythmic has been tried nor increase in flecainide/DCCV on flecainide.  He does not notice irregular heart beat but is aware of decreased exercise tolerance, dyspnea with increased activities, fatigue. He does snore/ apneic spells per wife but has not had a sleep study.He does drink 5-6 acholic drinks a week. This patients CHA2DS2-VASc Score and unadjusted Ischemic Stroke Rate (% per year) is equal to 0.2 % stroke rate/year from a score of 0  Above score calculated as 1 point each if present [CHF, HTN, DM, Vascular=MI/PAD/Aortic Plaque, Age if 65-74, or Male] Above score calculated as 2 points each if present [Age > 75, or Stroke/TIA/TE]  Today, he denies symptoms of palpitations, chest pain, shortness of breath, orthopnea, PND, lower extremity edema, dizziness, presyncope, syncope, snoring, daytime somnolence, bleeding, or neurologic sequela. The patient is tolerating medications without difficulties and is otherwise without complaint today.    Atrial Fibrillation Risk Factors:  he does have symptoms or diagnosis of sleep apnea.  he does not have a history of rheumatic fever.  he does have a history of alcohol use.  he has a BMI of Body mass index is 35.56 kg/(m^2).Marland Kitchen Filed Weights   03/02/14 0858  Weight: 247 lb 12.8 oz (112.401 kg)    LA size: 58mm   Atrial  Fibrillation Management history:  Previous antiarrhythmic drugs: flecainide  Previous cardioversions: x1 with ERAF 3/15.  Previous ablations: none  CHADS2VASC score: 0  Anticoagulation history:apixaban   Past Medical History  Diagnosis Date  . Atrial fibrillation   . GERD (gastroesophageal reflux disease)   . Allergy     RHINITIS  . Arthritis   . Asthma   . Hyperlipidemia   . PAF (paroxysmal atrial fibrillation)   . H/O hiatal hernia   . Dysrhythmia     afib  . Complication of anesthesia     b reathing problem after surgery   Past Surgical History  Procedure Laterality Date  . Anterior cruciate ligament repair      BILATERAL  . Cholecystectomy  2010  . Total knee arthroplasty  2010  . Cardioversion N/A 07/08/2013    Procedure: CARDIOVERSION;  Surgeon: [name], MD;  Location: [facility] ENDOSCOPY;  Service: Cardiovascular;  Laterality: N/A;    Current Outpatient Prescriptions  Medication Sig Dispense Refill  . apixaban (ELIQUIS) 5 MG TABS tablet Take 1 tablet (5 mg total) by mouth 2 (two) times daily. 60 tablet 0  . cetirizine (ZYRTEC) 10 MG tablet Take 10 mg by mouth daily as needed for allergies or rhinitis.     . clindamycin (CLEOCIN T) 1 % lotion Apply topically 2 (two) times daily. (Patient taking differently: Apply 1 application topically 2 (two) times daily as needed (rash). ) 60 mL 11  . diltiazem (TIAZAC) 360 MG 24 hr capsule TAKE ONE CAPSULE DAILY (Patient taking differently: TAKE ONE BY MOUTH CAPSULE DAILY) 30 capsule 5  . flecainide (TAMBOCOR) 100 MG tablet Take 1 tablet (100  mg total) by mouth 2 (two) times daily. 180 tablet 3  . fluticasone (FLONASE) 50 MCG/ACT nasal spray Place 2 sprays into the nose as needed. (Patient taking differently: Place 2 sprays into the nose daily as needed for allergies or rhinitis. ) 48 g 3  . mometasone (ASMANEX) 220 MCG/INH inhaler Inhale 2 puffs into the lungs daily. (Patient taking differently: Inhale 2 puffs into the  lungs daily as needed (shortness of breath or wheezing). ) 3 Inhaler 3  . omeprazole (PRILOSEC) 40 MG capsule Take 1 capsule (40 mg total) by mouth daily. 90 capsule 3  . [DISCONTINUED] diltiazem (CARDIZEM CD) 360 MG 24 hr capsule TAKE 1 CAPSULE BY MOUTH ONCE DAILY 30 capsule 3   No current facility-administered medications for this visit.    No Known Allergies  History   Social History  . Marital Status: Married    Spouse Name: N/A    Number of Children: N/A  . Years of Education: N/A   Occupational History  . Not on file.   Social History Main Topics  . Smoking status: Former Research scientist (life sciences)  . Smokeless tobacco: Never Used  . Alcohol Use: 0.5 oz/week    1 drink(s) per week  . Drug Use: No  . Sexual Activity: Yes   Other Topics Concern  . Not on file   Social History Narrative    No family history on file. The patient does have a history of early familial atrial fibrillation or other arrhythmias.  ROS- All systems are reviewed and negative except as per the HPI above.  Physical Exam: Filed Vitals:   03/02/14 0858  BP: 120/88  Pulse: 78  Height: 5\' 10"  (1.778 m)  Weight: 247 lb 12.8 oz (112.401 kg)    GEN- The patient is well appearing, alert and oriented x 3 today.   Head- normocephalic, atraumatic Eyes-  Sclera clear, conjunctiva pink Ears- hearing intact Oropharynx- clear Neck- supple, no JVP Lymph- no cervical lymphadenopathy Lungs- Clear to ausculation bilaterally, normal work of breathing Heart- Irregular rate and rhythm, no murmurs, rubs or gallops, PMI not laterally displaced GI- soft, NT, ND, + BS Extremities- no clubbing, cyanosis, or edema MS- no significant deformity or atrophy Skin- no rash or lesion Psych- euthymic mood, full affect Neuro- strength and sensation are intact  EKG afib with rightward axsis, ventricular rate 78 bpm,QRS 80 ms, QTc 491ms  Echo- Left ventricle: The cavity size was normal. Systolic function was normal. The estimated  ejection fraction was in the range of 60% to 65%. Wall motion was normal; there were no regional wall motion abnormalities. - Left atrium: The atrium was mildly dilated. - Atrial septum: No defect or patent foramen ovale was identified.   Epic records are reviewed at length today  Assessment and Plan:  1. Atrial fibrillation The patient has Symptomatic persistent atrial fibrillation.  The patients CHAD2VASC score is 0.  he is  appropriately anticoagulated at this time. The patient is adequately rate controlled with diltiazem.Marland Kitchen Antiarrhythmic therapy to dates has included flecainide 50 mg bid..  The patients left atrial size is 38 mm.   Additional echo findings include normal EF. A long discussion with the patient was had today regarding therapeutic strategies.  Extensive discussion of lifestyle modification including decrease or avoid alcohol intake was also discussed., as well as possible sleep apnea/need for sleep study and need for more regular exercise pattern.  Presently, our recommendations include :  It may be difficult to get patient back into rhythm due  to longstanding persistent afib, and he may not be the best candidate for ablation due to this and some lifestyle issues.. Will first attempt to  increase flecainide to 100 mg bid with pt returning to clinic in one week for EKG. If still in afib, will schedule DCCV. Pt. has consistently taken NOAC without interruption. Flecainide level today.  2. Obestiy As above, lifestyle modification was discussed at length including regular exercise and weight reduction.  Body mass index is 35.56 kg/(m^2).   3. Obstructive sleep apnea The importance of adequate treatment of sleep apnea was discussed today in order to improve our ability to maintain sinus rhythm long term.Has snoring with witnessed apnea spells. Sleep study scheduled.  4. Alcohol use Decrease to no more than 2 drinks a week.  5. F/u afib clinic in 4 weeks.     Roderic Palau NP  Nurse Practitioner, [facility]   I have seen, examined the patient, and reviewed the above assessment and plan.  Changes to above are made where necessary.  The patient has persistent afib.  He has failed medical therapy with low dose flecainide.  I would advise increasing flecainide to 100mg  BID and repeating cardioversion.  IF he continues to have afib then our options are tikosyn or ablation.  After lifestyle modification, I would favor ablation.  HE will follow-up with Roderic Palau NP in the AF clinic   Co Sign: Thompson Grayer, MD 03/02/2014 9:18 PM

## 2014-03-09 ENCOUNTER — Ambulatory Visit (INDEPENDENT_AMBULATORY_CARE_PROVIDER_SITE_OTHER): Payer: BC Managed Care – PPO | Admitting: *Deleted

## 2014-03-09 VITALS — BP 137/82 | HR 70 | Ht 70.0 in | Wt 250.4 lb

## 2014-03-09 DIAGNOSIS — I481 Persistent atrial fibrillation: Secondary | ICD-10-CM

## 2014-03-09 DIAGNOSIS — I4819 Other persistent atrial fibrillation: Secondary | ICD-10-CM

## 2014-03-09 NOTE — Patient Instructions (Signed)
Your physician recommends that you continue on your current medications as directed. Please refer to the Current Medication list given to you today.     

## 2014-03-09 NOTE — Progress Notes (Signed)
Patient recently seen in AFib clinic for persistent AFib. Increased Flecainide to 100 mg BID on 11/23, with f/u EKG today. EKG today shows SR w/ PACs - reviewed by Dr. Burt Knack (DOD). No orders received.  Will forward information to Roderic Palau, NP w/ AFib clinic, and Dr. Rayann Heman.      1.  Reason for visit: EKG secondary to increased Flecainide dosing  2.  Name of MD requesting visit:  Roderic Palau, NP/ Dr. Rayann Heman  3. H&P:  Persistent A Fib  4.  ROS related to problem:  N/A  5.  Assessment and plan per MD:  EKG reviewed by DOD, Dr. Burt Knack. No orders received.

## 2014-04-10 DIAGNOSIS — G4730 Sleep apnea, unspecified: Secondary | ICD-10-CM

## 2014-04-10 HISTORY — DX: Sleep apnea, unspecified: G47.30

## 2014-04-11 ENCOUNTER — Other Ambulatory Visit: Payer: Self-pay | Admitting: Cardiovascular Disease

## 2014-05-12 ENCOUNTER — Telehealth: Payer: Self-pay | Admitting: Internal Medicine

## 2014-05-12 NOTE — Telephone Encounter (Signed)
New Msg        Pt needs a prior authorization for a sleep study that is scheduled for next week.    Insurance telephone # is is [phone].   Pt wife would like a return call once this is completed.

## 2014-05-19 ENCOUNTER — Ambulatory Visit (HOSPITAL_BASED_OUTPATIENT_CLINIC_OR_DEPARTMENT_OTHER): Payer: 59 | Attending: Internal Medicine

## 2014-05-19 VITALS — Ht 70.0 in | Wt 245.0 lb

## 2014-05-19 DIAGNOSIS — I4819 Other persistent atrial fibrillation: Secondary | ICD-10-CM

## 2014-05-19 DIAGNOSIS — G4710 Hypersomnia, unspecified: Secondary | ICD-10-CM

## 2014-05-19 DIAGNOSIS — I481 Persistent atrial fibrillation: Secondary | ICD-10-CM | POA: Diagnosis not present

## 2014-05-19 NOTE — Telephone Encounter (Signed)
05-19-14 [employer] CPT 95811 (SPLIT NIGHT SLEEP STUDY) Approved by [place] Director/Ref#[id number]/Notification#[phone].  Pt aware.

## 2014-05-26 ENCOUNTER — Telehealth: Payer: Self-pay | Admitting: Cardiology

## 2014-05-26 DIAGNOSIS — G4733 Obstructive sleep apnea (adult) (pediatric): Secondary | ICD-10-CM

## 2014-05-26 NOTE — Telephone Encounter (Signed)
Please let patient know that he has severe sleep apnea and recommend proceeding with CPAP titration. Please set up.

## 2014-05-26 NOTE — Telephone Encounter (Signed)
Left message to call back  

## 2014-05-26 NOTE — Sleep Study (Signed)
   NAME: [name] DATE OF BIRTH:  May 09, 1965 MEDICAL RECORD NUMBER [id number]  LOCATION: [facility]  PHYSICIAN: Kaydin Karbowski R  DATE OF STUDY: 05/19/2014  SLEEP STUDY TYPE: Nocturnal Polysomnogram               REFERRING PHYSICIAN: [place] Grayer, MD  INDICATION FOR STUDY: excessive daytime sleepiness and atrial fibrillation  EPWORTH SLEEPINESS SCORE: 10 HEIGHT: 5\' 10"  (177.8 cm)  WEIGHT: 245 lb (111.131 kg)    Body mass index is 35.15 kg/(m^2).  NECK SIZE: 17 in.  MEDICATIONS: Reviewed in the chart  SLEEP ARCHITECTURE: The patient slept for a total of 265 minutes out of a total recording time of 360 minutes.  There was no slow wave sleep and reduced REM sleep at 36 minutes.  The onset to sleep latency was prolonged at 86 minutes and onset to REM sleep latency was prolonged at 170 minutes.  The sleep efficiency was reduced at 73%.    RESPIRATORY DATA: There were a total of 137 apneas, of which, 98 were obstructive, 11 central and 28 mixed apneas.  There were a total of 105 obstructive hypopneas.  Most events occurred in the non supine position and during NREM sleep.  The overall AHI was 54 events per hour consistent with severe obstructive sleep apnea/hypopnea syndrome.  There was moderate to severe snoring.  OXYGEN DATA: The lowest oxygen saturation during REM sleep was 64% and during NREM sleep was 70%.  O2 saturations were <88% for a total of 122 minutes.  CARDIAC DATA: The patient was in atrial fibrillation throughout the study with an average HR of 77bpm.  The maximum HR during sleep was 196bpm and minimum HR was 32bpm.    MOVEMENT/PARASOMNIA: There were no periodic limb movements or REM sleep behavior disorders.    IMPRESSION/ RECOMMENDATION:   1.  Severe Obstructive Sleep Apnea/Hypopnea Syndrome with AHI of 54 events per hour.  Most events occurred in the non supine position during NREM sleep. 2.  Reduced sleep efficiency with increased frequency of  awakenings due to respiratory events. 3.  Prolonged onset and reduced REM sleep. 4.  Significant oxygen desaturations as low as 64% during REM sleep with time spent with oxygen desaturations <88% of 122 minutes.  5.  Moderate to severe snoring.   6.  Atrial fibrillation was noted during the study with average HR of 77bpm.  Maximum HR during sleep was 196bpm and minimum HR 32bpm. 7.  Given the degree of sleep disordered breathing with excessive daytime sleepiness and atrial fibrillation, recommend proceeding with CPAP titration.  Signed: Sueanne Margarita Diplomate, American Board of Sleep Medicine  ELECTRONICALLY SIGNED ON:  05/26/2014, 4:20 PM [place] PH: [phone]   FX: [phone] [place]

## 2014-05-27 NOTE — Telephone Encounter (Signed)
Left message to call back  

## 2014-05-29 NOTE — Telephone Encounter (Signed)
Patient informed of results and verbal understanding expressed.  CPAP titration ordered for scheduling. Patient agrees with treatment plan.

## 2014-05-29 NOTE — Addendum Note (Signed)
Addended by: Harland German A on: 05/29/2014 04:50 PM   Modules accepted: Orders

## 2014-06-09 ENCOUNTER — Ambulatory Visit (INDEPENDENT_AMBULATORY_CARE_PROVIDER_SITE_OTHER): Payer: 59 | Admitting: Family Medicine

## 2014-06-09 ENCOUNTER — Encounter: Payer: Self-pay | Admitting: Family Medicine

## 2014-06-09 VITALS — BP 120/90 | HR 67 | Wt 249.0 lb

## 2014-06-09 DIAGNOSIS — I481 Persistent atrial fibrillation: Secondary | ICD-10-CM | POA: Diagnosis not present

## 2014-06-09 DIAGNOSIS — I4819 Other persistent atrial fibrillation: Secondary | ICD-10-CM

## 2014-06-09 DIAGNOSIS — K219 Gastro-esophageal reflux disease without esophagitis: Secondary | ICD-10-CM | POA: Diagnosis not present

## 2014-06-09 DIAGNOSIS — G4733 Obstructive sleep apnea (adult) (pediatric): Secondary | ICD-10-CM

## 2014-06-09 DIAGNOSIS — J4521 Mild intermittent asthma with (acute) exacerbation: Secondary | ICD-10-CM

## 2014-06-09 DIAGNOSIS — J302 Other seasonal allergic rhinitis: Secondary | ICD-10-CM

## 2014-06-09 MED ORDER — MOMETASONE FUROATE 220 MCG/INH IN AEPB: 2.0000 | INHALATION_SPRAY | Freq: Every day | RESPIRATORY_TRACT | Status: DC

## 2014-06-09 MED ORDER — OMEPRAZOLE 40 MG PO CPDR: 40.0000 mg | DELAYED_RELEASE_CAPSULE | Freq: Every day | ORAL | Status: DC

## 2014-06-09 NOTE — Progress Notes (Signed)
   Subjective:    Patient ID: Alan Thompson, male    DOB: [birth year], 49 y.o.   MRN: [id number]  HPI He is here for medication management visit. He does have reflux disease and is on Prilosec for this. He does note that his eating habits and the types of foods to play a role in this. He does sometimes miss and has had no difficulty with that. He does have asthma but usually only in the spring associated with allergies. He does plan to start taking this again. He does have underlying atrial fibrillation and is in the process of being evaluated for possible ablation. He recently had a sleep study done which was apparently positive. He is scheduled for a titration in May.    Review of Systems     Objective:   Physical Exam Alert and in no distress otherwise not examined       Assessment & Plan:  Gastroesophageal reflux disease without esophagitis - Plan: omeprazole (PRILOSEC) 40 MG capsule  Asthma - Plan: mometasone (ASMANEX) 220 MCG/INH inhaler  Persistent atrial fibrillation  Allergic rhinitis, seasonal  OSA (obstructive sleep apnea)  encouraged him to use the Prilosec on an as-needed basis rather than on a regular basis. I will renew his Asmanex. He is to renew his Flonase to help with his allergies which will start soon. Discussed his sleep apnea as well as weight reduction to help with that. Also encouraged him to set up a complete exam sometime next fall.

## 2014-06-14 ENCOUNTER — Encounter (HOSPITAL_BASED_OUTPATIENT_CLINIC_OR_DEPARTMENT_OTHER): Payer: 59

## 2014-08-03 ENCOUNTER — Ambulatory Visit: Payer: 59 | Admitting: Cardiovascular Disease

## 2014-08-05 ENCOUNTER — Other Ambulatory Visit: Payer: Self-pay | Admitting: Cardiovascular Disease

## 2014-08-19 ENCOUNTER — Ambulatory Visit (HOSPITAL_BASED_OUTPATIENT_CLINIC_OR_DEPARTMENT_OTHER): Payer: 59 | Attending: Cardiology | Admitting: Radiology

## 2014-08-19 DIAGNOSIS — G4733 Obstructive sleep apnea (adult) (pediatric): Secondary | ICD-10-CM | POA: Diagnosis not present

## 2014-08-19 DIAGNOSIS — I4891 Unspecified atrial fibrillation: Secondary | ICD-10-CM | POA: Insufficient documentation

## 2014-08-31 NOTE — Progress Notes (Signed)
Patient ID: Alan Thompson, male   DOB: [birth year], 49 y.o.   MRN: [id number] [facility]    Primary Care Physician: Wyatt Haste, MD Primary Cardiologist:Dr.Larrell Rapozo Primary Electrophysiologist:Dr. Allred Referring Physician: Dr. Berline Chough Alan Thompson is a 49 y.o. male with a h/o  persistent atrial fibrillation   The patient was initially diagnosed with PAF in 2010 followed by Dr. Verl Blalock, initially asymptomatic.Was treated with asa and diltiazem initially. I placed him on Eliquis 06/13/13.He had DCCV 07/08/13 with ERAF, holding only 1-2 weeks.Flecainide 50 bid was started 07/21/13.  Seen by Dr Rayann Heman in [facility] November 2015 and flecainide increased to 100 bid  F/U ECG showed SR with PAC;s  Has had positive sleep study and wearing CPAP now.    Atrial Fibrillation Risk Factors:  he does have symptoms or diagnosis of sleep apnea.  he does not have a history of rheumatic fever.  he does have a history of alcohol use.  he has a BMI of There is no weight on file to calculate BMI.. There were no vitals filed for this visit.  LA size: 3mm   Atrial Fibrillation Management history:  Previous antiarrhythmic drugs: flecainide  Previous cardioversions: x1 with ERAF 3/15.  Previous ablations: none  CHADS2VASC score: 0  Anticoagulation history:apixaban   Past Medical History  Diagnosis Date  . Atrial fibrillation   . GERD (gastroesophageal reflux disease)   . Allergy     RHINITIS  . Arthritis   . Asthma   . Hyperlipidemia   . PAF (paroxysmal atrial fibrillation)   . H/O hiatal hernia   . Dysrhythmia     afib  . Complication of anesthesia     b reathing problem after surgery   Past Surgical History  Procedure Laterality Date  . Anterior cruciate ligament repair      BILATERAL  . Cholecystectomy  2010  . Total knee arthroplasty  2010  . Cardioversion N/A 07/08/2013    Procedure: CARDIOVERSION;  Surgeon: [name], MD;  Location: [facility];  Service:  Cardiovascular;  Laterality: N/A;    Current Outpatient Prescriptions  Medication Sig Dispense Refill  . cetirizine (ZYRTEC) 10 MG tablet Take 10 mg by mouth daily as needed for allergies or rhinitis.     . clindamycin (CLEOCIN T) 1 % lotion Apply topically 2 (two) times daily. (Patient taking differently: Apply 1 application topically 2 (two) times daily as needed (rash). ) 60 mL 11  . diltiazem (CARDIZEM CD) 360 MG 24 hr capsule TAKE ONE CAPSULE BY MOUTH EVERY DAY 30 capsule 1  . ELIQUIS 5 MG TABS tablet TAKE 1 TABLET BY MOUTH TWICE A DAY 180 tablet 1  . flecainide (TAMBOCOR) 100 MG tablet Take 1 tablet (100 mg total) by mouth 2 (two) times daily. 180 tablet 3  . fluticasone (FLONASE) 50 MCG/ACT nasal spray Place 2 sprays into the nose as needed. (Patient taking differently: Place 2 sprays into the nose daily as needed for allergies or rhinitis. ) 48 g 3  . mometasone (ASMANEX) 220 MCG/INH inhaler Inhale 2 puffs into the lungs daily. 3 Inhaler 3  . omeprazole (PRILOSEC) 40 MG capsule Take 1 capsule (40 mg total) by mouth daily. 90 capsule 3   No current facility-administered medications for this visit.    No Known Allergies  History   Social History  . Marital Status: Married    Spouse Name: N/A  . Number of Children: N/A  . Years of Education: N/A   Occupational History  .  Not on file.   Social History Main Topics  . Smoking status: Former Research scientist (life sciences)  . Smokeless tobacco: Never Used  . Alcohol Use: 0.5 oz/week    1 drink(s) per week  . Drug Use: No  . Sexual Activity: Yes   Other Topics Concern  . Not on file   Social History Narrative    No family history on file. The patient does have a history of early familial atrial fibrillation or other arrhythmias.  ROS- All systems are reviewed and negative except as per the HPI above.  Physical Exam: There were no vitals filed for this visit.  GEN- The patient is well appearing, alert and oriented x 3 today.   Head-  normocephalic, atraumatic Eyes-  Sclera clear, conjunctiva pink Ears- hearing intact Oropharynx- clear Neck- supple, no JVP Lymph- no cervical lymphadenopathy Lungs- Clear to ausculation bilaterally, normal work of breathing Heart- Irregular rate and rhythm, no murmurs, rubs or gallops, PMI not laterally displaced GI- soft, NT, ND, + BS Extremities- no clubbing, cyanosis, or edema MS- no significant deformity or atrophy Skin- no rash or lesion Psych- euthymic mood, full affect Neuro- strength and sensation are intact  EKG afib with rightward axsis, ventricular rate 78 bpm,QRS 80 ms, QTc 420ms  Echo- Left ventricle: The cavity size was normal. Systolic function was normal. The estimated ejection fraction was in the range of 60% to 65%. Wall motion was normal; there were no regional wall motion abnormalities. - Left atrium: The atrium was mildly dilated. - Atrial septum: No defect or patent foramen ovale was identified.   Epic records are reviewed at length today  Assessment and Plan:  1. Atrial fibrillation The patient has Symptomatic persistent atrial fibrillation.  The patients CHAD2VASC score is 0.  he is  appropriately anticoagulated at this time. The patient is adequately rate controlled with diltiazem.Marland Kitchen Antiarrhythmic therapy to dates has included flecainide 50 mg bid..  The patients left atrial size is 38 mm.   Additional echo findings include normal EF. A long discussion with the patient was had today regarding therapeutic strategies.  Extensive discussion of lifestyle modification including decrease or avoid alcohol intake was also discussed., as well as possible sleep apnea/need for sleep study and need for more regular exercise pattern.  Presently, our recommendations include :  It may be difficult to get patient back into rhythm due to longstanding persistent afib, and he may not be the best candidate for ablation due to this and some lifestyle issues.. Will  first attempt to  increase flecainide to 100 mg bid with pt returning to clinic in one week for EKG. If still in afib, will schedule DCCV. Pt. has consistently taken NOAC without interruption. Flecainide level today.  2. Obestiy As above, lifestyle modification was discussed at length including regular exercise and weight reduction.  There is no weight on file to calculate BMI.   3. Obstructive sleep apnea The importance of adequate treatment of sleep apnea was discussed today in order to improve our ability to maintain sinus rhythm long term.Has snoring with witnessed apnea spells. Sleep study scheduled.  4. Alcohol use Decrease to no more than 2 drinks a week.  5. F/u afib clinic in 4 weeks.   Jenkins Rouge

## 2014-09-02 ENCOUNTER — Encounter: Payer: 59 | Admitting: Cardiovascular Disease

## 2014-09-06 ENCOUNTER — Telehealth: Payer: Self-pay | Admitting: Cardiology

## 2014-09-06 NOTE — Telephone Encounter (Signed)
Pt had unsuccessful CPAP titration. Please setup repeat in lab study using BiPAP

## 2014-09-06 NOTE — Sleep Study (Signed)
   NAME: Alan Thompson DATE OF BIRTH:  1966-02-13 MEDICAL RECORD NUMBER [id number]  LOCATION: [facility]  PHYSICIAN: Annie Saephan R  DATE OF STUDY: 08/19/2014  SLEEP STUDY TYPE: Positive Airway Pressure Titration               REFERRING PHYSICIAN: Sueanne Margarita, MD  INDICATION FOR STUDY: OSA  EPWORTH SLEEPINESS SCORE: 10 HEIGHT: 5'10"  WEIGHT: 240 lbs  NECK SIZE: 17 in.  MEDICATIONS: Reviewed in the chart  SLEEP ARCHITECTURE: The patient slept for a total of 340 minutes out of a total sleep period of 374 minutes.  There was no slow wave sleep and 68 minutes of REM sleep.  The onset to sleep latency was 2.5 minutes and onset to REM sleep latency was normal at 108 minutes.  The sleep efficiency was normal at 90%.    RESPIRATORY DATA: The patient was started on CPAP at 5cm H2O and titrated for respiratory events and snoring to 19cm H2O.  CPAP was switched to BiPAP at 21/17cm H2O and titrated to 24/20cm H2O.  The AHI at 19cm H2O was 49 events per hour.    OXYGEN DATA: The lowest oxygen saturation during the study was 84% and the average oxygen saturation was 94%.  The time spent with oxygen saturations <88% was 1.8 minutes.   CARDIAC DATA: The patient maintained atrial fibrillation during the study.  The average heart rate was 83 bpm.  The lowest heart rate was 32 bpm and the highest heart rate was 153 bpm.    MOVEMENT/PARASOMNIA: There were no limb movements or REM sleep behavior disorders noted.  IMPRESSION/ RECOMMENDATION:   1.  Unsuccessful CPAP titration due to ongoing respiratory events at 19cm H2O.   The AHI at 19cm H2O was 49 events per hour.   2.  Abnormal sleep architecture with no slow wave sleep. 3.  Oxygen desaturations were noted as low as 84%.  The time spent with oxygen saturations <88% was 1.8 minutes 4.  The patient maintained atrial fibrillation with an average heart rate of 83 bpm during the study. 5.  The patient was tried, but unsuccessful  at a trial of CPAP therapy due to ongoing respiratory events.  Recommend repeat in lab study using BiPAP titration.  Signed: Sueanne Margarita Diplomate, American Board of Sleep Medicine  ELECTRONICALLY SIGNED ON:  09/06/2014, 8:41 PM [place] PH: [phone]   FX: [phone] [place]

## 2014-09-08 ENCOUNTER — Other Ambulatory Visit: Payer: Self-pay | Admitting: *Deleted

## 2014-09-08 DIAGNOSIS — G4733 Obstructive sleep apnea (adult) (pediatric): Secondary | ICD-10-CM

## 2014-09-08 NOTE — Telephone Encounter (Signed)
Patient  Is aware that a BiPAP titration is needed. He has been at this process for a few months now, but finally agreed to the titration. It is scheduled for 11/18/14.

## 2014-09-08 NOTE — Telephone Encounter (Signed)
Patient BiPAP titration rescheduled for 09/11/14

## 2014-09-11 ENCOUNTER — Ambulatory Visit (HOSPITAL_BASED_OUTPATIENT_CLINIC_OR_DEPARTMENT_OTHER): Payer: 59 | Attending: Cardiology

## 2014-09-11 VITALS — Ht 70.0 in | Wt 247.0 lb

## 2014-09-11 DIAGNOSIS — G4730 Sleep apnea, unspecified: Secondary | ICD-10-CM | POA: Diagnosis not present

## 2014-09-11 DIAGNOSIS — G4733 Obstructive sleep apnea (adult) (pediatric): Secondary | ICD-10-CM

## 2014-09-11 DIAGNOSIS — I519 Heart disease, unspecified: Secondary | ICD-10-CM

## 2014-09-23 ENCOUNTER — Telehealth: Payer: Self-pay | Admitting: Internal Medicine

## 2014-09-23 NOTE — Telephone Encounter (Signed)
New message      Calling to get sleep study results and CPAP information

## 2014-09-24 NOTE — Telephone Encounter (Signed)
Patient is very upset that he does not have his results yet.  He stated that this is a broken process and very unprofessional. He said under no circumstance should it take 2 weeks or more for someone to receive results. He said he has been going through this process for months now, and he is very stressed about it. He stated that he wanted someone to know how dissatisfied he was with "[facility]" because this is taking so long to do.      I apologized for the delay, and told him that I would call him with results as soon as I possibly could.

## 2014-09-26 ENCOUNTER — Telehealth: Payer: Self-pay | Admitting: Cardiology

## 2014-09-26 NOTE — Telephone Encounter (Signed)
Pt had successful PAP titration. Please setup appointment in 10 weeks. Please let [facility] know that order for PAP is in EPIC.   

## 2014-09-26 NOTE — Sleep Study (Signed)
   NAME: Alan Thompson DATE OF BIRTH:  Aug 28, 1965 MEDICAL RECORD NUMBER [id number]  LOCATION: [facility]  PHYSICIAN: Euline Kimbler R  DATE OF STUDY: 09/11/2014  SLEEP STUDY TYPE: Positive Airway Pressure Titration               REFERRING PHYSICIAN: Sueanne Margarita, MD  INDICATION FOR STUDY: Sleep apnea and snoring  EPWORTH SLEEPINESS SCORE: 10 HEIGHT: 5\' 10"  (177.8 cm)  WEIGHT: 247 lb (112.038 kg)    Body mass index is 35.44 kg/(m^2).  NECK SIZE: 17 in.  MEDICATIONS: Reviewed in the chart  SLEEP ARCHITECTURE: The patient slept for a total of 358 minutes out of a total sleep period time of 404 minutes.  There was no slow wave sleep and 58 minutes of REM sleep.  The onset to sleep latency was 1.5 minutes and the onset to REM sleep latency was prolonged at 144 minutes.  The sleep efficiency was normal at 87%.    RESPIRATORY DATA: The patient was started on BiPAP at 8/4cm H2O and titrated for respiratory events and snoring to 24/20cm H2O.  The patient was able to achieve an optimum pressure of 17/13cm H2O and maintain REM supine sleep for a prolonged period of time without further respiratory events.  The AHI at 17/13cm H2O was 4.2 events per hour.  There was no snoring noted.  OXYGEN DATA: The average oxygen saturation was The lowest oxygen saturation at BiPAP 17/13cm H2O was 88%.    CARDIAC DATA: The patient maintained NSR throuhgout the study.  MOVEMENT/PARASOMNIA: There were nor periodic limb movements or REM sleep behavioral disorders noted.    IMPRESSION/ RECOMMENDATION:   1.  Successful BiPAP titration to 17/13cm H2O.   The patient was able to achieve an optimum pressure of 17/13cm H2O and maintain REM supine sleep for a prolonged period of time without further respiratory events.  The AHI at this pressure was 4.2 events per hour.  2.  No snoring was noted. 3.  The lowest oxygen saturation at a BiPAP of 17/13cm H2O was 88%. 4.  Recommend BiPAP at 17/13cm H2O  with Biflex of 3, heated humidity and medium [employer] Simplus full face mask. 5.  Treatment would also include careful attention to proper sleep hygiene, weight reduction if the BMI is elevated, avoidance of sleeping in the supine position and avoidance of alcohol within four hours of bedtime.  Specific treatment decisions should be tailored to each patient based upon the clinical situation and all treatment options should be considered.  The patient should be instructed to avoid driving if sleepy and careful clinical follow up is needed to ensure that the patient's symptoms are improving with therapy and the PAP adherence is supported and measured if prescribed.    Signed: Sueanne Margarita Diplomate, American Board of Sleep Medicine  ELECTRONICALLY SIGNED ON:  09/26/2014, 3:20 PM [place] PH: [phone]   FX: [phone] [place]

## 2014-09-26 NOTE — Addendum Note (Signed)
Addended by: Fransico Him R on: 09/26/2014 03:30 PM   Modules accepted: Orders

## 2014-09-28 NOTE — Telephone Encounter (Signed)
Left patient a detailed message that His CPAP Titration was successful.  Patient has [place] so I am contacting [facility] for CPAP/Supplies.  They will start processing everything and contact the patient.   Once he gets his machine, I will set up the 10 week office visit.

## 2014-10-19 ENCOUNTER — Other Ambulatory Visit: Payer: Self-pay | Admitting: Internal Medicine

## 2014-10-19 ENCOUNTER — Other Ambulatory Visit: Payer: Self-pay | Admitting: Cardiovascular Disease

## 2014-11-18 ENCOUNTER — Encounter (HOSPITAL_BASED_OUTPATIENT_CLINIC_OR_DEPARTMENT_OTHER): Payer: 59

## 2014-12-02 ENCOUNTER — Other Ambulatory Visit: Payer: Self-pay | Admitting: Cardiovascular Disease

## 2014-12-10 ENCOUNTER — Other Ambulatory Visit: Payer: Self-pay | Admitting: Internal Medicine

## 2014-12-18 ENCOUNTER — Telehealth: Payer: Self-pay | Admitting: Cardiology

## 2014-12-18 NOTE — Telephone Encounter (Signed)
Ivin Booty just needed to know when patient would be following up so that I could send them OV notes for insurance.

## 2014-12-18 NOTE — Telephone Encounter (Signed)
NewMessage  Sharon from [facility] calling to speak w/ Romelle Starcher concerning pt's CPAP. Please call back and discuss.

## 2014-12-21 ENCOUNTER — Encounter: Payer: Self-pay | Admitting: Cardiology

## 2014-12-22 ENCOUNTER — Ambulatory Visit (INDEPENDENT_AMBULATORY_CARE_PROVIDER_SITE_OTHER): Payer: 59 | Admitting: Cardiology

## 2014-12-22 ENCOUNTER — Encounter: Payer: Self-pay | Admitting: Cardiology

## 2014-12-22 VITALS — BP 112/78 | HR 59 | Ht 70.0 in | Wt 259.6 lb

## 2014-12-22 DIAGNOSIS — E669 Obesity, unspecified: Secondary | ICD-10-CM | POA: Diagnosis not present

## 2014-12-22 DIAGNOSIS — G4733 Obstructive sleep apnea (adult) (pediatric): Secondary | ICD-10-CM | POA: Diagnosis not present

## 2014-12-22 NOTE — Progress Notes (Signed)
Cardiology Office Note   Date:  12/22/2014   ID:  Alan Thompson, DOB July 30, 1965, MRN [id number]  PCP:  Wyatt Haste, MD    No chief complaint on file.     History of Present Illness: Alan Thompson is a 49 y.o. male who presents for evaluation of OSA.  He was referred by Dr. Rayann Heman for PSG due to excessive daytime sleepiness and atrial fibrillation.  He underwent PSG showing severe OSA with an AHI of 54/hr.  Most events occurred in NREM sleep and in the non supine position.  Oxygen saturations dropped to 64%. He had unsuccessful CPAP titration due to ongoing events and underwent BiPAP titration to 17/13cm H2O.  The AHI on this pressure was 4.2 events per hour.  Lowest oxygen saturation was 88%.  He presents today for followup.  He is doing well with his device.  He tolerates the full face mask and feels the pressure is adequate.  His d/l showed an AHI of 5.9/hr on 17/13cm H2O.  His is 100% compliant in using more than 4 hours nightly.  Since going on BiPAP, he feels more rested in the am and no daytime sleepiness.    Past Medical History  Diagnosis Date  . Atrial fibrillation   . GERD (gastroesophageal reflux disease)   . Allergy     RHINITIS  . Arthritis   . Asthma   . Hyperlipidemia   . PAF (paroxysmal atrial fibrillation)   . H/O hiatal hernia   . Dysrhythmia     afib  . Complication of anesthesia     b reathing problem after surgery    Past Surgical History  Procedure Laterality Date  . Anterior cruciate ligament repair      BILATERAL  . Cholecystectomy  2010  . Total knee arthroplasty  2010  . Cardioversion N/A 07/08/2013    Procedure: CARDIOVERSION;  Surgeon: [name], MD;  Location: [facility] END[facility]COPY;  Service: Cardiovascular;  Laterality: N/A;     Current Outpatient Prescriptions  Medication Sig Dispense Refill  . cetirizine (ZYRTEC) 10 MG tablet Take 10 mg by mouth daily as needed for allergies or rhinitis.     . clindamycin  (CLEOCIN T) 1 % lotion Apply topically 2 (two) times daily. (Patient taking differently: Apply 1 application topically 2 (two) times daily as needed (rash). ) 60 mL 11  . diltiazem (CARDIZEM CD) 360 MG 24 hr capsule TAKE ONE CAPSULE BY MOUTH EVERY DAY 30 capsule 0  . ELIQUIS 5 MG TABS tablet TAKE 1 TABLET BY MOUTH TWICE A DAY 60 tablet 0  . flecainide (TAMBOCOR) 100 MG tablet Take 1 tablet (100 mg total) by mouth 2 (two) times daily. 180 tablet 3  . fluticasone (FLONASE) 50 MCG/ACT nasal spray Place 2 sprays into the nose as needed. (Patient taking differently: Place 2 sprays into the nose daily as needed for allergies or rhinitis. ) 48 g 3  . mometasone (ASMANEX) 220 MCG/INH inhaler Inhale 2 puffs into the lungs daily. 3 Inhaler 3  . omeprazole (PRILOSEC) 40 MG capsule Take 1 capsule (40 mg total) by mouth daily. 90 capsule 3   No current facility-administered medications for this visit.    Allergies:   Review of patient's allergies indicates no known allergies.    Social History:  The patient  reports that he has quit smoking. He has never used smokeless tobacco. He reports that  he drinks about 0.5 oz of alcohol per week. He reports that he does not use illicit drugs.   Family History:  The patient's family history includes Arrhythmia in his mother; Prostate cancer in his father.    ROS:  Please see the history of present illness.   Otherwise, review of systems are positive for none.   All other systems are reviewed and negative.    PHYSICAL EXAM: VS:  BP 112/78 mmHg  Pulse 59  Ht 5\' 10"  (1.778 m)  Wt 259 lb 9.6 oz (117.754 kg)  BMI 37.25 kg/m2  SpO2 98% , BMI Body mass index is 37.25 kg/(m^2). GEN: Well nourished, well developed, in no acute distress HEENT: normal Neck: no JVD, carotid bruits, or masses Cardiac: RRR; no murmurs, rubs, or gallops,no edema  Respiratory:  clear to auscultation bilaterally, normal work of breathing GI: soft, nontender, nondistended, + BS MS: no  deformity or atrophy Skin: warm and dry, no rash Neuro:  Strength and sensation are intact Psych: euthymic mood, full affect   EKG:  EKG is not ordered today.    Recent Labs: 01/26/2014: BUN 20; Creatinine, Ser 1.1; Hemoglobin 15.1; Platelets 235.0; Potassium 4.0; Sodium 137    Lipid Panel    Component Value Date/Time   CHOL 234* 05/22/2013 1242   TRIG 260* 05/22/2013 1242   HDL 44 05/22/2013 1242   CHOLHDL 5.3 05/22/2013 1242   VLDL 52* 05/22/2013 1242   LDLCALC 138* 05/22/2013 1242      Wt Readings from Last 3 Encounters:  12/22/14 259 lb 9.6 oz (117.754 kg)  09/11/14 247 lb (112.038 kg)  06/09/14 249 lb (112.946 kg)      ASSESSMENT AND PLAN:  1.  Severe OSA well controlled on BiPAP at 17/13cm H2O.  Patient has been using and benefiting from CPAP use and will continue to benefit from therapy. I have also instructed the patient on proper sleep hygiene, avoidance of sleeping in the supine position and avoidance of alcohol within 4 hours of bedtime.  The patient was also instructed to avoid driving if sleepy.   2.  Obesity - I have encouraged him to get into a routine exercise program.    Current medicines are reviewed at length with the patient today.  The patient does not have concerns regarding medicines.  The following changes have been made:  no change  Labs/ tests ordered today: See above Assessment and Plan No orders of the defined types were placed in this encounter.     Disposition:   FU with me in 6 months  Signed, Sueanne Margarita, MD  12/22/2014 4:43 PM    [facility] [place], [place], [place]  [address] Phone: [phone]; Fax: [phone]

## 2014-12-22 NOTE — Patient Instructions (Signed)

## 2014-12-23 NOTE — Progress Notes (Signed)
Left a message to please make an appointment for cpe for this fall

## 2015-01-07 ENCOUNTER — Encounter: Payer: Self-pay | Admitting: Cardiology

## 2015-01-11 ENCOUNTER — Other Ambulatory Visit: Payer: Self-pay | Admitting: Cardiovascular Disease

## 2015-02-20 ENCOUNTER — Other Ambulatory Visit: Payer: Self-pay | Admitting: Internal Medicine

## 2015-02-20 ENCOUNTER — Other Ambulatory Visit: Payer: Self-pay | Admitting: Family Medicine

## 2015-02-22 NOTE — Telephone Encounter (Signed)
Patient was instructed at last office visit with Dr Rayann Heman, 02/2014, to have flecainide level checked. He has not done so. Please advise on refill. Thanks, MI

## 2015-02-22 NOTE — Telephone Encounter (Signed)
Please fill and let him know he needs a follow up in afib clinic

## 2015-02-22 NOTE — Telephone Encounter (Signed)
Is this okay?

## 2015-04-14 ENCOUNTER — Other Ambulatory Visit: Payer: Self-pay | Admitting: Internal Medicine

## 2015-04-22 ENCOUNTER — Other Ambulatory Visit: Payer: 59

## 2015-06-01 ENCOUNTER — Other Ambulatory Visit: Payer: Self-pay | Admitting: Family Medicine

## 2015-07-06 ENCOUNTER — Other Ambulatory Visit: Payer: Self-pay | Admitting: Internal Medicine

## 2015-09-20 ENCOUNTER — Other Ambulatory Visit: Payer: Self-pay | Admitting: Family Medicine

## 2015-09-20 ENCOUNTER — Other Ambulatory Visit: Payer: Self-pay | Admitting: Cardiology

## 2015-09-23 ENCOUNTER — Other Ambulatory Visit: Payer: Self-pay | Admitting: Family Medicine

## 2015-10-18 ENCOUNTER — Encounter: Payer: Self-pay | Admitting: Internal Medicine

## 2015-10-18 ENCOUNTER — Other Ambulatory Visit: Payer: Self-pay | Admitting: Family Medicine

## 2015-10-18 ENCOUNTER — Ambulatory Visit (INDEPENDENT_AMBULATORY_CARE_PROVIDER_SITE_OTHER): Payer: 59 | Admitting: Internal Medicine

## 2015-10-18 VITALS — BP 122/88 | HR 71 | Ht 70.0 in | Wt 262.8 lb

## 2015-10-18 DIAGNOSIS — I4819 Other persistent atrial fibrillation: Secondary | ICD-10-CM

## 2015-10-18 DIAGNOSIS — I481 Persistent atrial fibrillation: Secondary | ICD-10-CM

## 2015-10-18 DIAGNOSIS — G4733 Obstructive sleep apnea (adult) (pediatric): Secondary | ICD-10-CM

## 2015-10-18 DIAGNOSIS — I340 Nonrheumatic mitral (valve) insufficiency: Secondary | ICD-10-CM

## 2015-10-18 MED ORDER — ASPIRIN EC 81 MG PO TBEC: 81.0000 mg | DELAYED_RELEASE_TABLET | Freq: Every day | ORAL | Status: DC

## 2015-10-18 MED ORDER — FLECAINIDE ACETATE 100 MG PO TABS: 100.0000 mg | ORAL_TABLET | Freq: Two times a day (BID) | ORAL | Status: DC

## 2015-10-18 NOTE — Progress Notes (Signed)
Primary Care Physician: Wyatt Haste, MD Primary [place] Primary Electrophysiologist:Dr. Rayann Heman  Alan Thompson is a 50 y.o. male with a h/o  persistent atrial fibrillation who presents for follow-up. He has done amazingly well with treatment of his OSA.  Energy is much improved.  AF is better controlled.  Rare (1-2 times per year) afib and lasting <30 minutes.  He is not very active and continues to drink ETOH.   This patients CHA2DS2-VASc Score and unadjusted Ischemic Stroke Rate (% per year) is equal to 0.2 % stroke rate/year from a score of 0 but remains on eliquis.  Today, he denies symptoms of palpitations, chest pain, shortness of breath, orthopnea, PND, lower extremity edema, dizziness, presyncope, syncope, snoring, daytime somnolence, bleeding, or neurologic sequela. The patient is tolerating medications without difficulties and is otherwise without complaint today.    Atrial Fibrillation Risk Factors:  he does have symptoms or diagnosis of sleep apnea. + compliant with therapy  he does not have a history of rheumatic fever.  he does have a history of alcohol use.  he has a BMI of Body mass index is 37.71 kg/(m^2).Marland Kitchen Filed Weights   10/18/15 1515  Weight: 262 lb 12.8 oz (119.205 kg)    LA size: 55mm   Atrial Fibrillation Management history:  Previous antiarrhythmic drugs: flecainide  Previous cardioversions: x1 with ERAF 3/15.  Previous ablations: none  CHADS2VASC score: 0  Anticoagulation history:apixaban   Past Medical History  Diagnosis Date  . Atrial fibrillation ([place])   . GERD (gastroesophageal reflux disease)   . Allergy     RHINITIS  . Arthritis   . Asthma   . Hyperlipidemia   . PAF (paroxysmal atrial fibrillation) ([place])   . H/O hiatal hernia   . Dysrhythmia     afib  . Complication of anesthesia     b reathing problem after surgery   Past Surgical History  Procedure Laterality Date  . Anterior cruciate  ligament repair      BILATERAL  . Cholecystectomy  2010  . Total knee arthroplasty  2010  . Cardioversion N/A 07/08/2013    Procedure: CARDIOVERSION;  Surgeon: [name], MD;  Location: [facility] ENDOSCOPY;  Service: Cardiovascular;  Laterality: N/A;    Current Outpatient Prescriptions  Medication Sig Dispense Refill  . cetirizine (ZYRTEC) 10 MG tablet Take 10 mg by mouth daily as needed for allergies or rhinitis.     . clindamycin (CLEOCIN T) 1 % lotion Apply 1 application topically 2 (two) times daily. Apply to affected area    . flecainide (TAMBOCOR) 100 MG tablet Take 1 tablet (100 mg total) by mouth 2 (two) times daily. 180 tablet 3  . fluticasone (FLONASE) 50 MCG/ACT nasal spray Place 2 sprays into both nostrils daily as needed for allergies or rhinitis.    . mometasone (ASMANEX) 220 MCG/INH inhaler Inhale 2 puffs into the lungs daily as needed (shortness or breath or wheezing).    Marland Kitchen omeprazole (PRILOSEC) 40 MG capsule TAKE 1 CAPSULE (40 MG TOTAL) BY MOUTH DAILY. 90 capsule 3  . aspirin EC 81 MG tablet Take 1 tablet (81 mg total) by mouth daily. 90 tablet 3   No current facility-administered medications for this visit.    No Known Allergies  Social History   Social History  . Marital Status: Married    Spouse Name: N/A  . Number of Children: N/A  . Years of Education: N/A   Occupational History  . Not on file.  Social History Main Topics  . Smoking status: Former Research scientist (life sciences)  . Smokeless tobacco: Never Used  . Alcohol Use: 0.5 oz/week    1 Standard drinks or equivalent per week  . Drug Use: No  . Sexual Activity: Yes   Other Topics Concern  . Not on file   Social History Narrative    Family History  Problem Relation Age of Onset  . Arrhythmia Mother     atrial fibrillation  . Prostate cancer Father    The patient does have a history of early familial atrial fibrillation or other arrhythmias.  ROS- All systems are reviewed and negative except as per the HPI  above.  Physical Exam: Filed Vitals:   10/18/15 1515  BP: 122/88  Pulse: 71  Height: 5\' 10"  (1.778 m)  Weight: 262 lb 12.8 oz (119.205 kg)  SpO2: 96%    GEN- The patient is well appearing, alert and oriented x 3 today.   Head- normocephalic, atraumatic Eyes-  Sclera clear, conjunctiva pink Ears- hearing intact Oropharynx- clear Neck- supple,   Lungs- Clear to ausculation bilaterally, normal work of breathing Heart- Irregular rate and rhythm, no murmurs, rubs or gallops, PMI not laterally displaced GI- soft, NT, ND, + BS Extremities- no clubbing, cyanosis, or edema MS- no significant deformity or atrophy Skin- no rash or lesion Psych- euthymic mood, full affect Neuro- strength and sensation are intact  EKG reveals sinus rhythm 71 bpm, PR 192 msec, otherwise normal ekg  Echo- Left ventricle: The cavity size was normal. Systolic function was normal. The estimated ejection fraction was in the range of 60% to 65%. Wall motion was normal; there were no regional wall motion abnormalities. - Left atrium: The atrium was mildly dilated. - Atrial septum: No defect or patent foramen ovale was identified.  Assessment and Plan:  1. Atrial fibrillation Well controlled with flecainide chads2vasc score is 0.  Will therefore stop eliquis per guidelines Consider reducing flecainide to 50mg  BID upon return  2. Obestiy As above, lifestyle modification was discussed at length including regular exercise and weight reduction.  Body mass index is 37.71 kg/(m^2).  3. Obstructive sleep apnea Followed by Dr Radford Pax  4. Alcohol use Decrease to no more than 2 drinks a week.  Return to see me in 1 year Follow-up with Dr Radford Pax as scheduled  Thompson Grayer, MD 10/18/2015 4:07 PM

## 2015-10-18 NOTE — Patient Instructions (Signed)
Your physician has recommended you make the following change in your medication:  1.) stop eliquis 2.) start aspirin 81 mg once a day  Your physician wants you to follow-up in: 12 months with Dr. Rayann Heman.  You will receive a reminder letter in the mail two months in advance. If you don't receive a letter, please call our office to schedule the follow-up appointment.

## 2015-12-17 ENCOUNTER — Ambulatory Visit: Payer: 59 | Admitting: Cardiology

## 2015-12-24 ENCOUNTER — Encounter: Payer: Self-pay | Admitting: Cardiology

## 2016-01-24 ENCOUNTER — Ambulatory Visit (INDEPENDENT_AMBULATORY_CARE_PROVIDER_SITE_OTHER): Payer: 59 | Admitting: Cardiology

## 2016-01-24 ENCOUNTER — Encounter: Payer: Self-pay | Admitting: Cardiology

## 2016-01-24 VITALS — BP 126/70 | HR 68 | Ht 70.0 in | Wt 259.8 lb

## 2016-01-24 DIAGNOSIS — E669 Obesity, unspecified: Secondary | ICD-10-CM | POA: Diagnosis not present

## 2016-01-24 DIAGNOSIS — G4733 Obstructive sleep apnea (adult) (pediatric): Secondary | ICD-10-CM

## 2016-01-24 DIAGNOSIS — E66811 Obesity, class 1: Secondary | ICD-10-CM

## 2016-01-24 NOTE — Progress Notes (Signed)
Cardiology Office Note    Date:  01/24/2016   ID:  Alan Thompson, DOB 04-Apr-1966, MRN [id number]  PCP:  Wyatt Haste, MD  Cardiologist:  Fransico Him, MD   Chief Complaint  Patient presents with  . Sleep Apnea    History of Present Illness:  Alan Thompson is a 50 y.o. male who presents for follwoup of OSA.  He has severe OSA with an AHI of 54/hr. Oxygen saturations dropped to 64%. He is now on BiPAP at 17/13cm H2O.   He presents today for followup.  He is doing well with his device.  He tolerates the full face mask and feels the pressure is adequate.  Since going on BiPAP, he feels more rested in the am when getting enough sleep and no daytime sleepiness.  He does not nap during the day.  He rarely will have some mild mouth dryness in the am.  He uses the heated humidity.  He does not snore.      Past Medical History:  Diagnosis Date  . Allergy    RHINITIS  . Arthritis   . Asthma   . Atrial fibrillation ([place])   . Complication of anesthesia    b reathing problem after surgery  . Dysrhythmia    afib  . GERD (gastroesophageal reflux disease)   . H/O hiatal hernia   . Hyperlipidemia   . PAF (paroxysmal atrial fibrillation) ([place])     Past Surgical History:  Procedure Laterality Date  . ANTERIOR CRUCIATE LIGAMENT REPAIR     BILATERAL  . CARDIOVERSION N/A 07/08/2013   Procedure: CARDIOVERSION;  Surgeon: [name], MD;  Location: [place];  Service: Cardiovascular;  Laterality: N/A;  . CHOLECYSTECTOMY  2010  . TOTAL KNEE ARTHROPLASTY  2010    Current Medications: Outpatient Medications Prior to Visit  Medication Sig Dispense Refill  . aspirin EC 81 MG tablet Take 1 tablet (81 mg total) by mouth daily. 90 tablet 3  . cetirizine (ZYRTEC) 10 MG tablet Take 10 mg by mouth daily as needed for allergies or rhinitis.     . clindamycin (CLEOCIN T) 1 % lotion Apply 1 application topically 2 (two) times daily. Apply to affected area    . flecainide  (TAMBOCOR) 100 MG tablet Take 1 tablet (100 mg total) by mouth 2 (two) times daily. 180 tablet 3  . fluticasone (FLONASE) 50 MCG/ACT nasal spray Place 2 sprays into both nostrils daily as needed for allergies or rhinitis.    . mometasone (ASMANEX) 220 MCG/INH inhaler Inhale 2 puffs into the lungs daily as needed (shortness or breath or wheezing).    Marland Kitchen omeprazole (PRILOSEC) 40 MG capsule TAKE 1 CAPSULE (40 MG TOTAL) BY MOUTH DAILY. 90 capsule 3   No facility-administered medications prior to visit.      Allergies:   Review of patient's allergies indicates no known allergies.   Social History   Social History  . Marital status: Married    Spouse name: N/A  . Number of children: N/A  . Years of education: N/A   Social History Main Topics  . Smoking status: Former Research scientist (life sciences)  . Smokeless tobacco: Never Used  . Alcohol use 0.5 oz/week    1 Standard drinks or equivalent per week  . Drug use: No  . Sexual activity: Yes   Other Topics Concern  . None   Social History Narrative  . None     Family History:  The patient's family history includes Arrhythmia in his  mother; Prostate cancer in his father.   ROS:   Please see the history of present illness.    ROS All other systems reviewed and are negative.  No flowsheet data found.     PHYSICAL EXAM:   VS:  BP 126/70   Pulse 68   Ht 5\' 10"  (1.778 m)   Wt 259 lb 12.8 oz (117.8 kg)   BMI 37.28 kg/m    GEN: Well nourished, well developed, in no acute distress  HEENT: normal  Neck: no JVD, carotid bruits, or masses Cardiac: RRR; no murmurs, rubs, or gallops,no edema.  Intact distal pulses bilaterally.  Respiratory:  clear to auscultation bilaterally, normal work of breathing GI: soft, nontender, nondistended, + BS MS: no deformity or atrophy  Skin: warm and dry, no rash Neuro:  Alert and Oriented x 3, Strength and sensation are intact Psych: euthymic mood, full affect  Wt Readings from Last 3 Encounters:  01/24/16 259 lb 12.8  oz (117.8 kg)  10/18/15 262 lb 12.8 oz (119.2 kg)  12/22/14 259 lb 9.6 oz (117.8 kg)      Studies/Labs Reviewed:   EKG:  EKG is not ordered today.    Recent Labs: No results found for requested labs within last 8760 hours.   Lipid Panel    Component Value Date/Time   CHOL 234 (H) 05/22/2013 1242   TRIG 260 (H) 05/22/2013 1242   HDL 44 05/22/2013 1242   CHOLHDL 5.3 05/22/2013 1242   VLDL 52 (H) 05/22/2013 1242   LDLCALC 138 (H) 05/22/2013 1242    Additional studies/ records that were reviewed today include:  BiPAP download    ASSESSMENT:    1. OSA (obstructive sleep apnea)   2. Obesity (BMI 30.0-34.9)      PLAN:  In order of problems listed above:  OSA - the patient is tolerating PAP therapy well without any problems. The PAP download was reviewed today and showed an AHI of 0.9/hr on 17/13 cm H2O with 93% compliance in using more than 4 hours nightly.  The patient has been using and benefiting from CPAP use and will continue to benefit from therapy.  Obesity - I have encouraged him to get into a routine exercise program and cut back on carbs and portions.      Medication Adjustments/Labs and Tests Ordered: Current medicines are reviewed at length with the patient today.  Concerns regarding medicines are outlined above.  Medication changes, Labs and Tests ordered today are listed in the Patient Instructions below.  There are no Patient Instructions on file for this visit.   Signed, Fransico Him, MD  01/24/2016 3:17 PM    [facility] [address], [place], [place]  [address] Phone: [phone]; Fax: [phone]

## 2016-01-24 NOTE — Patient Instructions (Signed)
Medication Instructions:  Your physician recommends that you continue on your current medications as directed. Please refer to the Current Medication list given to you today.   Labwork: None  Testing/Procedures: None  Follow-Up: Your physician wants you to follow-up in: 1 year with Dr. Radford Pax. You will receive a reminder letter in the mail two months in advance. If you don't receive a letter, please call our office to schedule the follow-up appointment.   Any Other Special Instructions Will Be Listed Below (If Applicable). You have been given a script for a new BiPAP.    If you need a refill on your cardiac medications before your next appointment, please call your pharmacy.

## 2016-11-01 ENCOUNTER — Other Ambulatory Visit: Payer: Self-pay | Admitting: Internal Medicine

## 2016-11-01 ENCOUNTER — Other Ambulatory Visit: Payer: Self-pay | Admitting: Family Medicine

## 2016-11-01 NOTE — Telephone Encounter (Signed)
Called and informed pt denied med he has not been in since 06/2014

## 2016-11-02 ENCOUNTER — Other Ambulatory Visit: Payer: Self-pay | Admitting: Internal Medicine

## 2016-11-02 MED ORDER — FLECAINIDE ACETATE 100 MG PO TABS: 100.0000 mg | ORAL_TABLET | Freq: Two times a day (BID) | ORAL | 0 refills | Status: DC

## 2016-11-02 NOTE — Addendum Note (Signed)
Addended by: Derl Barrow on: 11/02/2016 08:26 AM   Modules accepted: Orders

## 2016-11-02 NOTE — Telephone Encounter (Signed)
Resent Rx because the first Rx was on print

## 2016-11-16 ENCOUNTER — Ambulatory Visit (INDEPENDENT_AMBULATORY_CARE_PROVIDER_SITE_OTHER): Payer: Managed Care, Other (non HMO) | Admitting: Family Medicine

## 2016-11-16 ENCOUNTER — Encounter: Payer: Self-pay | Admitting: Family Medicine

## 2016-11-16 VITALS — BP 120/80 | HR 70 | Ht 70.0 in | Wt 253.0 lb

## 2016-11-16 DIAGNOSIS — I340 Nonrheumatic mitral (valve) insufficiency: Secondary | ICD-10-CM

## 2016-11-16 DIAGNOSIS — I480 Paroxysmal atrial fibrillation: Secondary | ICD-10-CM

## 2016-11-16 DIAGNOSIS — Z23 Encounter for immunization: Secondary | ICD-10-CM

## 2016-11-16 DIAGNOSIS — K219 Gastro-esophageal reflux disease without esophagitis: Secondary | ICD-10-CM

## 2016-11-16 DIAGNOSIS — L738 Other specified follicular disorders: Secondary | ICD-10-CM | POA: Diagnosis not present

## 2016-11-16 DIAGNOSIS — J4520 Mild intermittent asthma, uncomplicated: Secondary | ICD-10-CM

## 2016-11-16 DIAGNOSIS — G4733 Obstructive sleep apnea (adult) (pediatric): Secondary | ICD-10-CM | POA: Diagnosis not present

## 2016-11-16 DIAGNOSIS — J301 Allergic rhinitis due to pollen: Secondary | ICD-10-CM | POA: Diagnosis not present

## 2016-11-16 LAB — CBC WITH DIFFERENTIAL/PLATELET
Basophils Absolute: 45 cells/uL (ref 0–200)
Basophils Relative: 1 %
Eosinophils Absolute: 315 cells/uL (ref 15–500)
Eosinophils Relative: 7 %
HCT: 39.2 % (ref 38.5–50.0)
Hemoglobin: 13.3 g/dL (ref 13.2–17.1)
Lymphocytes Relative: 44 %
Lymphs Abs: 1980 cells/uL (ref 850–3900)
MCH: 31.7 pg (ref 27.0–33.0)
MCHC: 33.9 g/dL (ref 32.0–36.0)
MCV: 93.6 fL (ref 80.0–100.0)
MPV: 8.9 fL (ref 7.5–12.5)
Monocytes Absolute: 315 cells/uL (ref 200–950)
Monocytes Relative: 7 %
Neutro Abs: 1845 cells/uL (ref 1500–7800)
Neutrophils Relative %: 41 %
Platelets: 211 10*3/uL (ref 140–400)
RBC: 4.19 MIL/uL — ABNORMAL LOW (ref 4.20–5.80)
RDW: 12.4 % (ref 11.0–15.0)
WBC: 4.5 10*3/uL (ref 4.0–10.5)

## 2016-11-16 LAB — COMPREHENSIVE METABOLIC PANEL
ALT: 16 U/L (ref 9–46)
AST: 15 U/L (ref 10–40)
Albumin: 4.3 g/dL (ref 3.6–5.1)
Alkaline Phosphatase: 62 U/L (ref 40–115)
BUN: 15 mg/dL (ref 7–25)
CO2: 22 mmol/L (ref 20–32)
Calcium: 9.1 mg/dL (ref 8.6–10.3)
Chloride: 106 mmol/L (ref 98–110)
Creat: 1.15 mg/dL (ref 0.60–1.35)
Glucose, Bld: 106 mg/dL — ABNORMAL HIGH (ref 65–99)
Potassium: 4.1 mmol/L (ref 3.5–5.3)
Sodium: 139 mmol/L (ref 135–146)
Total Bilirubin: 1 mg/dL (ref 0.2–1.2)
Total Protein: 7 g/dL (ref 6.1–8.1)

## 2016-11-16 LAB — LIPID PANEL
Cholesterol: 249 mg/dL — ABNORMAL HIGH (ref <200)
HDL: 51 mg/dL (ref >40)
LDL Cholesterol: 162 mg/dL — ABNORMAL HIGH (ref <100)
Total CHOL/HDL Ratio: 4.9 Ratio (ref <5.0)
Triglycerides: 181 mg/dL — ABNORMAL HIGH (ref <150)
VLDL: 36 mg/dL — ABNORMAL HIGH (ref <30)

## 2016-11-16 MED ORDER — CLOBETASOL PROPIONATE 0.05 % EX SOLN: 1.0000 "application " | Freq: Two times a day (BID) | CUTANEOUS | 0 refills | Status: DC

## 2016-11-16 MED ORDER — OMEPRAZOLE 40 MG PO CPDR: DELAYED_RELEASE_CAPSULE | ORAL | 3 refills | Status: DC

## 2016-11-16 MED ORDER — CLINDAMYCIN PHOSPHATE 1 % EX LOTN: 1.0000 "application " | TOPICAL_LOTION | Freq: Two times a day (BID) | CUTANEOUS | 5 refills | Status: DC

## 2016-11-16 MED ORDER — MOMETASONE FUROATE 220 MCG/INH IN AEPB: 2.0000 | INHALATION_SPRAY | Freq: Every day | RESPIRATORY_TRACT | 5 refills | Status: DC | PRN

## 2016-11-16 NOTE — Progress Notes (Signed)
   Subjective:    Patient ID: Alan Thompson, male    DOB: 1965-12-13, 51 y.o.   MRN: [id number]  HPI He is here for medication management visit. He does have a history of PAF as well as OSA. Now that he is on CPAP it has literally turned his world around. He has now lost 20 pounds and plans to lose more. He continues on Zyrtec for his allergies and is doing quite nicely on that. He does have asthma mainly during the allergy season and does use Asmanex for that. He is also on Tambocor for his heart rhythm. He does follow-up periodically with cardiology. His reflux is under good control. He would like refills on medications for his scalp as he does occasionally get infections. His work and home life are going quite well.   Review of Systems     Objective:   Physical Exam Alert and in no distress. Tympanic membranes and canals are normal. Pharyngeal area is normal. Neck is supple without adenopathy or thyromegaly. Cardiac exam shows a regular sinus rhythm without murmurs or gallops. Lungs are clear to auscultation.        Assessment & Plan:  OSA (obstructive sleep apnea) - Plan: CBC with Differential/Platelet, Comprehensive metabolic panel, Lipid panel  Need for Tdap vaccination - Plan: Tdap vaccine greater than or equal to 7yo IM  Seasonal allergic rhinitis due to pollen  Mild intermittent asthma in adult without complication - Plan: CBC with Differential/Platelet, Comprehensive metabolic panel, mometasone (ASMANEX) 220 MCG/INH inhaler  Mitral valve insufficiency, unspecified etiology - Plan: CBC with Differential/Platelet, Comprehensive metabolic panel, Lipid panel  Gastroesophageal reflux disease without esophagitis - Plan: omeprazole (PRILOSEC) 40 MG capsule  Paroxysmal atrial fibrillation (HCC) - Plan: CBC with Differential/Platelet, Comprehensive metabolic panel, Lipid panel  Folliculitis barbae - Plan: clindamycin (CLEOCIN T) 1 % lotion, clobetasol (CORMAX SCALP APPLICATION)  6.96 % external solution  In general things are going quite well for him he will continue on present medication regimen. He uses the allergy and asthma meds on an as-needed basis and I recommended continuing to do that.

## 2016-11-21 ENCOUNTER — Other Ambulatory Visit: Payer: Self-pay | Admitting: Internal Medicine

## 2017-02-05 ENCOUNTER — Other Ambulatory Visit: Payer: Self-pay | Admitting: Internal Medicine

## 2017-02-20 ENCOUNTER — Encounter: Payer: Self-pay | Admitting: Cardiology

## 2017-03-15 ENCOUNTER — Telehealth: Payer: Self-pay | Admitting: Cardiology

## 2017-03-15 NOTE — Telephone Encounter (Signed)
New Message     Patient wife wants a referral for someone to handle her husband sleep in [place] they are not available in the time frame that is open to have his sleep check up   They need appt from  12/18 to Jan 7th , can they see Dr Claiborne Billings for his yearly checkup before they leave to go back to [place] on the 8th of Jan.   Leave detailed message on machine if they can see Dr Claiborne Billings and who she will refer them to in [place]

## 2017-03-16 NOTE — Telephone Encounter (Signed)
Reached out to the patient and spoke to his wife DPR and explained to her that her husband can not just switch providers it is a process to switch. Informed wife to have the patient call our office with a time he can make an appointment in February in our office when the doctor has a lot of availability. Patient's wife verbalized understanding and said she will have her husband to call.

## 2017-04-16 ENCOUNTER — Ambulatory Visit: Payer: 59 | Admitting: Cardiology

## 2017-05-15 ENCOUNTER — Other Ambulatory Visit: Payer: Self-pay | Admitting: Internal Medicine

## 2017-05-17 ENCOUNTER — Telehealth: Payer: Self-pay | Admitting: Internal Medicine

## 2017-05-17 NOTE — Telephone Encounter (Signed)
Marcelle Smiling, Hospitalist at [facility] calling and states that the patient has been admitted for pre-syncope. He states that the patient presented with Afib RVR and has converted to Afib in the 80s. He was asking if there were any plans on ablation for this patient and if he has close follow-up. Made Legrand Como aware that the patient has not seen Dr. Rayann Heman since 10/18/15 and was advised to follow up in a year. Made him aware that the patient is scheduled to see Dr. Rayann Heman on 06/13/17. Legrand Como states that the patient is stable at this time and is asymptomatic. He states that he will fax the patient's information over to Dr. Rayann Heman. Fax number provided. Will forward to Dr. Rayann Heman as Juluis Rainier.

## 2017-05-17 NOTE — Telephone Encounter (Signed)
New Message   Legrand Como calling from [facility] is calling to advise that patient is being admitted to the hospital for pre-syncope. Please call to discuss.

## 2017-06-01 HISTORY — PX: OTHER SURGICAL HISTORY: SHX169

## 2017-06-08 HISTORY — PX: ATRIAL FIBRILLATION ABLATION: EP1191

## 2017-06-10 ENCOUNTER — Other Ambulatory Visit: Payer: Self-pay | Admitting: Internal Medicine

## 2017-06-13 ENCOUNTER — Ambulatory Visit: Payer: 59 | Admitting: Internal Medicine

## 2017-06-14 ENCOUNTER — Encounter: Payer: Managed Care, Other (non HMO) | Admitting: Family Medicine

## 2017-09-04 ENCOUNTER — Other Ambulatory Visit: Payer: Self-pay | Admitting: Family Medicine

## 2017-09-04 DIAGNOSIS — K219 Gastro-esophageal reflux disease without esophagitis: Secondary | ICD-10-CM

## 2017-11-30 ENCOUNTER — Ambulatory Visit: Payer: 59 | Admitting: Family Medicine

## 2017-11-30 ENCOUNTER — Encounter: Payer: Self-pay | Admitting: Family Medicine

## 2017-11-30 VITALS — BP 110/62 | HR 66 | Temp 98.2°F | Wt 260.4 lb

## 2017-11-30 DIAGNOSIS — I480 Paroxysmal atrial fibrillation: Secondary | ICD-10-CM | POA: Diagnosis not present

## 2017-11-30 DIAGNOSIS — Z7901 Long term (current) use of anticoagulants: Secondary | ICD-10-CM | POA: Diagnosis not present

## 2017-11-30 DIAGNOSIS — I340 Nonrheumatic mitral (valve) insufficiency: Secondary | ICD-10-CM

## 2017-11-30 DIAGNOSIS — J4520 Mild intermittent asthma, uncomplicated: Secondary | ICD-10-CM

## 2017-11-30 DIAGNOSIS — E669 Obesity, unspecified: Secondary | ICD-10-CM

## 2017-11-30 DIAGNOSIS — J301 Allergic rhinitis due to pollen: Secondary | ICD-10-CM

## 2017-11-30 DIAGNOSIS — Z1211 Encounter for screening for malignant neoplasm of colon: Secondary | ICD-10-CM

## 2017-11-30 DIAGNOSIS — Z23 Encounter for immunization: Secondary | ICD-10-CM | POA: Diagnosis not present

## 2017-11-30 DIAGNOSIS — G4733 Obstructive sleep apnea (adult) (pediatric): Secondary | ICD-10-CM

## 2017-11-30 DIAGNOSIS — K219 Gastro-esophageal reflux disease without esophagitis: Secondary | ICD-10-CM

## 2017-11-30 MED ORDER — OMEPRAZOLE 40 MG PO CPDR: DELAYED_RELEASE_CAPSULE | ORAL | 0 refills | Status: AC

## 2017-11-30 NOTE — Progress Notes (Signed)
   Subjective:    Patient ID: Alan Thompson, male    DOB: 04-04-1966, 52 y.o.   MRN: [id number]  HPI He is here for an interval evaluation.  He lives mainly in [place] but does spend some time down here.  He did have difficulty with atrial fibrillation requiring an ablation.  This was done in May.  He presently is on Eliquis and also has a loop recorder in place.  He also has a history of mitral valve insufficiency.  His allergies seem to be under good control.  He is really not having any trouble with his asthma.  He does have OSA and is on BiPAP which seems to be clicking quite nicely for him.  He continues to do nicely on Prilosec and would like a refill on that medication.  He is now 28 and does need follow-up concerning colon cancer.   Review of Systems     Objective:   Physical Exam Alert and in no distress. Tympanic membranes and canals are normal. Pharyngeal area is normal. Neck is supple without adenopathy or thyromegaly. Cardiac exam shows a regular sinus rhythm without murmurs or gallops. Lungs are clear to auscultation.        Assessment & Plan:  Paroxysmal atrial fibrillation (HCC)  Seasonal allergic rhinitis due to pollen  Anticoagulation adequate  Mild intermittent asthma in adult without complication  Gastroesophageal reflux disease without esophagitis - Plan: omeprazole (PRILOSEC) 40 MG capsule  Mitral valve insufficiency, unspecified etiology  Obesity (BMI 30.0-34.9)  OSA (obstructive sleep apnea)  Need for influenza vaccination - Plan: Flu Vaccine QUAD 6+ mos PF IM (Fluarix Quad PF)  Screening for colon cancer - Plan: Cologuard He will continue on his present medication regimen.  Most of his care will be done in [place].  Again stressed the need for him to make diet and exercise changes which would help with his overall health especially in regard to his OSA, reflux.  Over 25 minutes, greater than 50% spent in counseling and coordination of care.

## 2017-12-13 ENCOUNTER — Encounter: Payer: Self-pay | Admitting: Family Medicine

## 2018-02-25 ENCOUNTER — Encounter: Payer: Self-pay | Admitting: Family Medicine

## 2018-07-08 ENCOUNTER — Telehealth: Payer: Self-pay | Admitting: Family Medicine

## 2018-07-08 MED ORDER — OLOPATADINE HCL 0.1 % OP SOLN: 1.0000 [drp] | Freq: Two times a day (BID) | OPHTHALMIC | 12 refills | Status: DC

## 2018-07-08 NOTE — Telephone Encounter (Signed)
pts wife called and states that pt needs patanol eye drops, states his allergies are really bad, states you have filled this before, tried to get her to schedule him a virtual visit to discuss she wanted me to send this first to see if you would fill this before, pt uses [id number] - [place], [place] pt can be reached at [phone]

## 2019-08-11 ENCOUNTER — Encounter: Payer: Self-pay | Admitting: Family Medicine

## 2019-08-11 ENCOUNTER — Ambulatory Visit: Payer: 59 | Admitting: Family Medicine

## 2019-08-11 VITALS — BP 112/76 | HR 67 | Temp 97.8°F | Ht 70.0 in | Wt 268.0 lb

## 2019-08-11 DIAGNOSIS — Z0000 Encounter for general adult medical examination without abnormal findings: Secondary | ICD-10-CM

## 2019-08-11 DIAGNOSIS — K219 Gastro-esophageal reflux disease without esophagitis: Secondary | ICD-10-CM

## 2019-08-11 DIAGNOSIS — J301 Allergic rhinitis due to pollen: Secondary | ICD-10-CM | POA: Diagnosis not present

## 2019-08-11 DIAGNOSIS — G8929 Other chronic pain: Secondary | ICD-10-CM

## 2019-08-11 DIAGNOSIS — E669 Obesity, unspecified: Secondary | ICD-10-CM | POA: Diagnosis not present

## 2019-08-11 DIAGNOSIS — Z1211 Encounter for screening for malignant neoplasm of colon: Secondary | ICD-10-CM

## 2019-08-11 DIAGNOSIS — Z8679 Personal history of other diseases of the circulatory system: Secondary | ICD-10-CM

## 2019-08-11 DIAGNOSIS — Z87891 Personal history of nicotine dependence: Secondary | ICD-10-CM

## 2019-08-11 DIAGNOSIS — E66811 Obesity, class 1: Secondary | ICD-10-CM

## 2019-08-11 DIAGNOSIS — M25561 Pain in right knee: Secondary | ICD-10-CM

## 2019-08-11 DIAGNOSIS — M1990 Unspecified osteoarthritis, unspecified site: Secondary | ICD-10-CM

## 2019-08-11 DIAGNOSIS — L989 Disorder of the skin and subcutaneous tissue, unspecified: Secondary | ICD-10-CM

## 2019-08-11 DIAGNOSIS — G4733 Obstructive sleep apnea (adult) (pediatric): Secondary | ICD-10-CM | POA: Diagnosis not present

## 2019-08-11 LAB — POCT URINALYSIS DIP (PROADVANTAGE DEVICE)
Bilirubin, UA: NEGATIVE
Blood, UA: NEGATIVE
Glucose, UA: NEGATIVE mg/dL
Ketones, POC UA: NEGATIVE mg/dL
Leukocytes, UA: NEGATIVE
Nitrite, UA: NEGATIVE
Protein Ur, POC: NEGATIVE mg/dL
Specific Gravity, Urine: 1.015
Urobilinogen, Ur: 0.2
pH, UA: 6 (ref 5.0–8.0)

## 2019-08-11 NOTE — Progress Notes (Signed)
   Subjective:    Patient ID: Alan Thompson, male    DOB: 06-19-1965, 54 y.o.   MRN: [id number]  HPI He is here for complete examination.  His main concern today is right knee pain.  He has had ACL repair on both knees several years ago.  The right knee is now causing pain and he would like to pursue this further.  He also has lesions on both forearms that his wife would like him evaluated more thoroughly on.  He has a previous history of atrial fibrillation with ablation.  He now has a cardiac monitor and is being followed by a cardiologist at [facility] in [place].  He apparently has been stable without recurrence of atrial fib for well over a year.  He also has OSA and presently is on BiPAP.  He states that it is helping with his energy level.  He does have reflux disease and is using Prilosec on a regular basis.  He has started exercising again.  History reveals that he is a former smoker he does have underlying allergies and is on Flonase and Zyrtec.  He recently moved back to this area.  He has been traveling back and forth between here in [place].  He does have 1 child who apparently recently came out as transgender and he has concerns about that.  Otherwise his family and social history as well as health maintenance immunizations was reviewed.   Review of Systems  All other systems reviewed and are negative.      Objective:   Physical Exam Alert and in no distress. Tympanic membranes and canals are normal. Pharyngeal area is normal. Neck is supple without adenopathy or thyromegaly. Cardiac exam shows a regular sinus rhythm without murmurs or gallops. Lungs are clear to auscultation. Abdominal exam shows active bowel sounds without masses or tenderness. Exam of his forearms does show multiple slightly pigmented lesions mainly on the medial aspect of his forearms.     Assessment & Plan:  Routine general medical examination at a health care facility - Plan: CBC with  Differential/Platelet, Comprehensive metabolic panel, Lipid panel, POCT Urinalysis DIP (Proadvantage Device)  Seasonal allergic rhinitis due to pollen: Continue on present allergy medication.  Obesity (BMI 30.0-34.9):   Strongly encouraged him to get involved in a diet and exercise program.  OSA (obstructive sleep apnea): The BiPAP seems to be working.  Recommend he send me a readout from his machine.  I explained that I can follow him for this. Screening for colon cancer - Plan: Cologuard  Chronic pain of right knee - Plan: DG Knee Complete 4 Views Right, Ambulatory referral to Orthopedic Surgery  History of atrial fibrillation - Plan: Ambulatory referral to Cardiology  Skin lesions - Plan: Ambulatory referral to Dermatology  Gastroesophageal reflux disease without esophagitis:  Arthritis - Plan: DG Knee Complete 4 Views Right  OSA treated with BiPAP  Former smoker I then discussed the son who is considering transitioning to male.  Gave him the phone number of the [employer] to call to give information to help him deal with this.

## 2019-08-11 NOTE — Patient Instructions (Signed)
Call [facility] [phone]

## 2019-08-12 LAB — CBC WITH DIFFERENTIAL/PLATELET
Basophils Absolute: 0.1 10*3/uL (ref 0.0–0.2)
Basos: 1 %
EOS (ABSOLUTE): 0.5 10*3/uL — ABNORMAL HIGH (ref 0.0–0.4)
Eos: 9 %
Hematocrit: 42.4 % (ref 37.5–51.0)
Hemoglobin: 14.6 g/dL (ref 13.0–17.7)
Immature Grans (Abs): 0 10*3/uL (ref 0.0–0.1)
Immature Granulocytes: 0 %
Lymphocytes Absolute: 2.4 10*3/uL (ref 0.7–3.1)
Lymphs: 45 %
MCH: 31.8 pg (ref 26.6–33.0)
MCHC: 34.4 g/dL (ref 31.5–35.7)
MCV: 92 fL (ref 79–97)
Monocytes Absolute: 0.4 10*3/uL (ref 0.1–0.9)
Monocytes: 8 %
Neutrophils Absolute: 2 10*3/uL (ref 1.4–7.0)
Neutrophils: 37 %
Platelets: 218 10*3/uL (ref 150–450)
RBC: 4.59 x10E6/uL (ref 4.14–5.80)
RDW: 12.2 % (ref 11.6–15.4)
WBC: 5.4 10*3/uL (ref 3.4–10.8)

## 2019-08-12 LAB — COMPREHENSIVE METABOLIC PANEL
ALT: 35 IU/L (ref 0–44)
AST: 30 IU/L (ref 0–40)
Albumin/Globulin Ratio: 1.4 (ref 1.2–2.2)
Albumin: 4.6 g/dL (ref 3.8–4.9)
Alkaline Phosphatase: 53 IU/L (ref 39–117)
BUN/Creatinine Ratio: 13 (ref 9–20)
BUN: 15 mg/dL (ref 6–24)
Bilirubin Total: 1.4 mg/dL — ABNORMAL HIGH (ref 0.0–1.2)
CO2: 21 mmol/L (ref 20–29)
Calcium: 9.7 mg/dL (ref 8.7–10.2)
Chloride: 102 mmol/L (ref 96–106)
Creatinine, Ser: 1.16 mg/dL (ref 0.76–1.27)
GFR calc Af Amer: 83 mL/min/{1.73_m2} (ref >59)
GFR calc non Af Amer: 72 mL/min/{1.73_m2} (ref >59)
Globulin, Total: 3.2 g/dL (ref 1.5–4.5)
Glucose: 87 mg/dL (ref 65–99)
Potassium: 4.1 mmol/L (ref 3.5–5.2)
Sodium: 139 mmol/L (ref 134–144)
Total Protein: 7.8 g/dL (ref 6.0–8.5)

## 2019-08-12 LAB — LIPID PANEL
Chol/HDL Ratio: 5.2 ratio — ABNORMAL HIGH (ref 0.0–5.0)
Cholesterol, Total: 254 mg/dL — ABNORMAL HIGH (ref 100–199)
HDL: 49 mg/dL (ref >39)
LDL Chol Calc (NIH): 175 mg/dL — ABNORMAL HIGH (ref 0–99)
Triglycerides: 164 mg/dL — ABNORMAL HIGH (ref 0–149)
VLDL Cholesterol Cal: 30 mg/dL (ref 5–40)

## 2019-08-13 ENCOUNTER — Encounter: Payer: Self-pay | Admitting: Internal Medicine

## 2019-08-13 ENCOUNTER — Ambulatory Visit (INDEPENDENT_AMBULATORY_CARE_PROVIDER_SITE_OTHER): Payer: Self-pay | Admitting: Internal Medicine

## 2019-08-13 ENCOUNTER — Other Ambulatory Visit: Payer: Self-pay

## 2019-08-13 VITALS — BP 136/82 | HR 62 | Ht 70.0 in | Wt 270.0 lb

## 2019-08-13 DIAGNOSIS — I4819 Other persistent atrial fibrillation: Secondary | ICD-10-CM

## 2019-08-13 DIAGNOSIS — G4733 Obstructive sleep apnea (adult) (pediatric): Secondary | ICD-10-CM

## 2019-08-13 NOTE — Progress Notes (Signed)
Electrophysiology Office Note   Date:  08/13/2019   ID:  Alan Alan, DOB 29-Nov-1965, MRN [id number]  PCP:  Alan Lung, MD   Primary Electrophysiologist: Alan Grayer, MD    CC: afib   History of Present Illness: Alan Alan is a 54 y.o. male who presents today for electrophysiology evaluation.   He presents to re-establish.  He was seen by me previously for persistent afib.  He was doing well with flecainide at that time.  He moved to [place] for work.  His afib worsened and he underwent PVI at [facility] in 2019.  He also had ILR implated then.  He has done very well since that time. No further afib.  He is pleased with current health state.  Today, he denies symptoms of palpitations, chest pain, shortness of breath, orthopnea, PND, lower extremity edema, claudication, dizziness, presyncope, syncope, bleeding, or neurologic sequela. The patient is tolerating medications without difficulties and is otherwise without complaint today.    Past Medical History:  Diagnosis Date  . Allergy    RHINITIS  . Arthritis   . Asthma   . GERD (gastroesophageal reflux disease)   . H/O hiatal hernia   . Hyperlipidemia   . OSA (obstructive sleep apnea)    uses BiPAP  . Persistent atrial fibrillation [facility])    Past Surgical History:  Procedure Laterality Date  . ANTERIOR CRUCIATE LIGAMENT REPAIR     BILATERAL  . ATRIAL FIBRILLATION ABLATION  06/2017   PVI at [place] N/A 07/08/2013   Procedure: CARDIOVERSION;  Surgeon: Alan Spark, MD;  Location: [facility];  Service: Cardiovascular;  Laterality: N/A;  . CHOLECYSTECTOMY  2010  . implantable loop recorder  06/01/2017   MDT Reveal LINQ implanted by Dr Alan Alan in [place]  2010     Current Outpatient Medications  Medication Sig Dispense Refill  . aspirin EC 81 MG tablet Take 1 tablet (81 mg total) by mouth daily. 90 tablet 3  . cetirizine (ZYRTEC) 10 MG tablet Take 10 mg by mouth  daily as needed for allergies or rhinitis.     . clindamycin (CLEOCIN T) 1 % lotion Apply 1 application topically 2 (two) times daily. Apply to affected area 60 mL 5  . clobetasol (CORMAX SCALP APPLICATION) [id number] % external solution Apply 1 application topically 2 (two) times daily. 50 mL 0  . clobetasol (TEMOVATE) 0.05 % external solution Apply 1 application topically 2 (two) times daily.    . fluticasone (FLONASE) 50 MCG/ACT nasal spray Place 2 sprays into both nostrils daily as needed for allergies or rhinitis.    . mometasone (ASMANEX) 220 MCG/INH inhaler Inhale 2 puffs into the lungs daily as needed (shortness or breath or wheezing). 1 Inhaler 5  . olopatadine (PATANOL) 0.1 % ophthalmic solution Place 1 drop into both eyes 2 (two) times daily. 5 mL 12  . omeprazole (PRILOSEC) 40 MG capsule TAKE 1 CAPSULE BY MOUTH EVERY DAY 90 capsule 0   No current facility-administered medications for this visit.    Allergies:   Patient has no known allergies.   Social History:  The patient  reports that he has quit smoking. He has never used smokeless tobacco. He reports current alcohol use of about 1.0 standard drinks of alcohol per week. He reports that he does not use drugs.   Family History:  The patient's  family history includes Arrhythmia in his mother; Prostate cancer in  his father.    ROS:  Please see the history of present illness.   All other systems are personally reviewed and negative.    PHYSICAL EXAM: VS:  BP 136/82   Pulse 62   Ht 5\' 10"  (1.778 m)   Wt 270 lb (122.5 kg)   SpO2 98%   BMI 38.74 kg/m  , BMI Body mass index is 38.74 kg/m. GEN: Well nourished, well developed, in no acute distress  HEENT: normal  Neck: no JVD, carotid bruits, or masses Cardiac: RRR Respiratory:   normal work of breathing GI: soft  MS: no deformity or atrophy  Skin: warm and dry  Neuro:  Strength and sensation are intact Psych: euthymic mood, full affect   Recent Labs: 08/11/2019: ALT 35; BUN  15; Creatinine, Ser 1.16; Hemoglobin 14.6; Platelets 218; Potassium 4.1; Sodium 139  personally reviewed   Lipid Panel     Component Value Date/Time   CHOL 254 (H) 08/11/2019 1516   TRIG 164 (H) 08/11/2019 1516   HDL 49 08/11/2019 1516   CHOLHDL 5.2 (H) 08/11/2019 1516   CHOLHDL 4.9 11/16/2016 1520   VLDL 36 (H) 11/16/2016 1520   LDLCALC 175 (H) 08/11/2019 1516   personally reviewed   Wt Readings from Last 3 Encounters:  08/13/19 270 lb (122.5 kg)  08/11/19 268 lb (121.6 kg)  11/30/17 260 lb 6.4 oz (118.1 kg)    ekg today reveals sinus rhythm 62 bpm, PR 154 msec, QRS 82 msec, QTc 401 msec  MDT Reveal LINQ implanted reveals no afib    ASSESSMENT AND PLAN:  1.  Persistent afib Doing very well s/p ablation in 2019.  No recurrence chads2vasc score is 0.  He does not require Gregory therapy at this time.  2. OSA Compliant with Bipap  3. Morbid obesity Body mass index is 38.74 kg/m. Lifestyle modification is advised   Follow-up:  12 months with me  Current medicines are reviewed at length with the patient today.   The patient does not have concerns regarding his medicines.  The following changes were made today:  none  Labs/ tests ordered today include:  No orders of the defined types were placed in this encounter.    Alan Fossa, MD  08/13/2019 9:05 PM     [address] [place] [id number] [phone] (office) [phone] (fax)

## 2019-08-15 NOTE — Addendum Note (Signed)
Addended by: Rose Phi on: 08/15/2019 01:37 PM   Modules accepted: Orders

## 2019-08-18 ENCOUNTER — Ambulatory Visit (INDEPENDENT_AMBULATORY_CARE_PROVIDER_SITE_OTHER): Payer: 59 | Admitting: Orthopedic Surgery

## 2019-08-18 ENCOUNTER — Other Ambulatory Visit: Payer: Self-pay

## 2019-08-18 ENCOUNTER — Encounter: Payer: Self-pay | Admitting: Orthopedic Surgery

## 2019-08-18 ENCOUNTER — Ambulatory Visit: Payer: Self-pay

## 2019-08-18 VITALS — Ht 70.0 in | Wt 270.0 lb

## 2019-08-18 DIAGNOSIS — G8929 Other chronic pain: Secondary | ICD-10-CM | POA: Diagnosis not present

## 2019-08-18 DIAGNOSIS — M25561 Pain in right knee: Secondary | ICD-10-CM
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
## 2019-08-20 ENCOUNTER — Telehealth: Payer: Self-pay | Admitting: Emergency Medicine

## 2019-08-20 NOTE — Telephone Encounter (Signed)
Spoke with Rod Holler at [facility] and requested patient be transferred to our device clinic to follow remote transmissions. Device clinic # provided and fax # for correspondence.

## 2019-08-21 ENCOUNTER — Ambulatory Visit (INDEPENDENT_AMBULATORY_CARE_PROVIDER_SITE_OTHER): Payer: 59 | Admitting: *Deleted

## 2019-08-21 DIAGNOSIS — I480 Paroxysmal atrial fibrillation: Secondary | ICD-10-CM

## 2019-08-21 LAB — CUP PACEART REMOTE DEVICE CHECK: Date Time Interrogation Session: 20210513120554

## 2019-08-22 NOTE — Telephone Encounter (Signed)
Carelink transfer successful. Up to date with nightly automatic transmissions as of 08/22/19.

## 2019-08-25 NOTE — Progress Notes (Signed)
Carelink Summary Report / Loop Recorder 

## 2019-09-15 LAB — COLOGUARD: Cologuard: NEGATIVE

## 2019-09-22 ENCOUNTER — Telehealth: Payer: Self-pay

## 2019-09-22 NOTE — Progress Notes (Signed)
Pt was advised of negative cologuard. KH 

## 2019-09-22 NOTE — Telephone Encounter (Signed)
Carelink alert received 09/21/19 @ 19:02 for tachy event, EGM appears AF w/ RVR and ? run of NSVT 222bpm. Known AF, no OAC noted.   Called patient to assess, no answer. LMOVM. Would like to send patient to AF clinic if patient is agreeable.

## 2019-09-23 ENCOUNTER — Ambulatory Visit (INDEPENDENT_AMBULATORY_CARE_PROVIDER_SITE_OTHER): Payer: 59 | Admitting: *Deleted

## 2019-09-23 DIAGNOSIS — I480 Paroxysmal atrial fibrillation: Secondary | ICD-10-CM | POA: Diagnosis not present

## 2019-09-23 NOTE — Telephone Encounter (Signed)
The pt was returning [place] call. He left a message at 5:58 pm. His phone number is [phone].

## 2019-09-23 NOTE — Telephone Encounter (Signed)
Reviewed episode with Dr. 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 No changes recommended at this time.  Spoke with patient. Advised that per Dr. Rayann Heman, episode exhibits an SVT. Pt aware that Dr. Rayann Heman does not recommend changes at this time. Pt appreciative of call and denies additional questions at this time.

## 2019-09-23 NOTE — Telephone Encounter (Signed)
Spoke with patient regarding tachy episode from 09/21/19. Pt reports that he was on a bike ride for 1.75hrs and it involved some sprinting. Pt reports that his watts are typically 160 during a bike ride, but they were up to 320 watts during the sprints. Pt denies any symptoms with episode, including dizziness, palpitations, or chest discomfort. Confirmed taking ASA 81mg  daily, not on [place] as CHADS2VASC 0. S/P AF ablation in 2019. Advised pt I will review with MD and call him back with any recommendations. Pt verbalizes agreement with plan.

## 2019-09-24 LAB — CUP PACEART REMOTE DEVICE CHECK
Date Time Interrogation Session: 20210614232119
Implantable Pulse Generator Implant Date: 20190307

## 2019-09-24 NOTE — Progress Notes (Signed)
Carelink Summary Report / Loop Recorder 

## 2019-09-26 ENCOUNTER — Other Ambulatory Visit: Payer: Self-pay

## 2019-09-26 DIAGNOSIS — Z1211 Encounter for screening for malignant neoplasm of colon: Secondary | ICD-10-CM

## 2019-10-27 ENCOUNTER — Ambulatory Visit (INDEPENDENT_AMBULATORY_CARE_PROVIDER_SITE_OTHER): Payer: 59 | Admitting: *Deleted

## 2019-10-27 DIAGNOSIS — I480 Paroxysmal atrial fibrillation: Secondary | ICD-10-CM

## 2019-10-27 LAB — CUP PACEART REMOTE DEVICE CHECK
Date Time Interrogation Session: 20210718232343
Implantable Pulse Generator Implant Date: 20190307

## 2019-10-28 NOTE — Progress Notes (Signed)
Carelink Summary Report / Loop Recorder 

## 2019-11-03 ENCOUNTER — Ambulatory Visit: Payer: Self-pay | Admitting: Physician Assistant

## 2019-11-10 ENCOUNTER — Telehealth: Payer: Self-pay

## 2019-11-10 NOTE — Telephone Encounter (Signed)
Carelink alert received for possible AF episode- Transmission difficult to interpret, discernable p waves not appreciated.    Patient medications include ASA81 mg.  No antiarrhythmics on record.    Forwarding to Dr. Rayann Heman for review.

## 2019-11-11 NOTE — Telephone Encounter (Signed)
Carelink alert received 11/11/19 for 1.16 hours of AF?

## 2019-11-16 NOTE — Telephone Encounter (Signed)
Continue to monitor

## 2019-12-01 ENCOUNTER — Ambulatory Visit (INDEPENDENT_AMBULATORY_CARE_PROVIDER_SITE_OTHER): Payer: 59 | Admitting: *Deleted

## 2019-12-01 DIAGNOSIS — I480 Paroxysmal atrial fibrillation: Secondary | ICD-10-CM | POA: Diagnosis not present

## 2019-12-01 LAB — CUP PACEART REMOTE DEVICE CHECK
Date Time Interrogation Session: 20210820232526
Implantable Pulse Generator Implant Date: 20190307

## 2019-12-08 NOTE — Progress Notes (Signed)
Carelink Summary Report / Loop Recorder 

## 2019-12-17 ENCOUNTER — Telehealth: Payer: Self-pay

## 2019-12-17 NOTE — Telephone Encounter (Signed)
12/17/19 Recurrent carelink alerts received for AF episodes, longest 6.5 hours.    Known PAF, no OAC due to a CHADSVasc score of 0.  Per previous note on 09/22/19, previous episodes exhibits an SVT. During that time, patient was on a bike for 1.75 hours and involved sprinting.   Are there any recommendation on daily alerts? Or any changes at this time?

## 2020-01-04 LAB — CUP PACEART REMOTE DEVICE CHECK
Date Time Interrogation Session: 20210922233549
Implantable Pulse Generator Implant Date: 20190307

## 2020-01-04 NOTE — Telephone Encounter (Signed)
Ok to adjust to least sensitive AF detection with aggressive ectopy rejection.

## 2020-01-05 ENCOUNTER — Ambulatory Visit (INDEPENDENT_AMBULATORY_CARE_PROVIDER_SITE_OTHER): Payer: 59 | Admitting: Emergency Medicine

## 2020-01-05 DIAGNOSIS — I480 Paroxysmal atrial fibrillation: Secondary | ICD-10-CM | POA: Diagnosis not present

## 2020-01-05 NOTE — Telephone Encounter (Signed)
Attempted to reach patient to scheduled for device clinic to make recommended changes.  Also noted pt received an alert today for ongoing AF episode.  Requested patient callback to discuss/ schedule.

## 2020-01-08 ENCOUNTER — Telehealth: Payer: Self-pay

## 2020-01-08 NOTE — Telephone Encounter (Signed)
Paitent scheduled for 01/15/20 @ 10:30.

## 2020-01-08 NOTE — Progress Notes (Signed)
Carelink Summary Report / Loop Recorder 

## 2020-01-14 ENCOUNTER — Telehealth: Payer: Self-pay | Admitting: Emergency Medicine

## 2020-01-14 NOTE — Telephone Encounter (Addendum)
LINQ alert for AF episode that lasted 2 hours and 58 minutes on 01/13/20. Patient had ablation in 2019 due to AF with no episodes documented since procedure. Per Dr Jackalyn Lombard note 08/2019 chads2vasc score is 0.

## 2020-01-15 ENCOUNTER — Other Ambulatory Visit: Payer: Self-pay

## 2020-01-15 ENCOUNTER — Ambulatory Visit (INDEPENDENT_AMBULATORY_CARE_PROVIDER_SITE_OTHER): Payer: 59 | Admitting: Emergency Medicine

## 2020-01-15 DIAGNOSIS — I480 Paroxysmal atrial fibrillation: Secondary | ICD-10-CM

## 2020-01-15 LAB — CUP PACEART INCLINIC DEVICE CHECK
Date Time Interrogation Session: 20211007095542
Implantable Pulse Generator Implant Date: 20190307

## 2020-01-15 NOTE — Progress Notes (Signed)
Loop check in clinic. Battery status: Good. R-waves 0.58 mV. 0 symptom episodes, 1 tachy episodes, 0 pause episodes, 0 brady episodes. 47 AF episodes (0.5% burden). Patient in clinic for device reprogramming in response to recent increase in false AF episodes, per Dr. Rayann Heman AF detection adjusted to least sensitive with Aggressive Ectopy rejection.  Monthly summary reports and ROV with Dr. Rayann Heman.

## 2020-02-09 ENCOUNTER — Ambulatory Visit (INDEPENDENT_AMBULATORY_CARE_PROVIDER_SITE_OTHER): Payer: BC Managed Care – PPO

## 2020-02-09 DIAGNOSIS — I480 Paroxysmal atrial fibrillation: Secondary | ICD-10-CM

## 2020-02-09 LAB — CUP PACEART REMOTE DEVICE CHECK
Date Time Interrogation Session: 20211026000214
Implantable Pulse Generator Implant Date: 20190307

## 2020-02-10 LAB — CUP PACEART REMOTE DEVICE CHECK
Date Time Interrogation Session: 20211101000500
Implantable Pulse Generator Implant Date: 20190307

## 2020-02-12 NOTE — Progress Notes (Signed)
Carelink Summary Report / Loop Recorder 

## 2020-02-16 ENCOUNTER — Encounter: Payer: Self-pay | Admitting: Family Medicine

## 2020-03-15 ENCOUNTER — Ambulatory Visit (INDEPENDENT_AMBULATORY_CARE_PROVIDER_SITE_OTHER): Payer: BC Managed Care – PPO

## 2020-03-15 DIAGNOSIS — I480 Paroxysmal atrial fibrillation: Secondary | ICD-10-CM

## 2020-03-15 LAB — CUP PACEART REMOTE DEVICE CHECK
Date Time Interrogation Session: 20211127231259
Implantable Pulse Generator Implant Date: 20190307

## 2020-03-24 NOTE — Progress Notes (Signed)
Carelink Summary Report / Loop Recorder 

## 2020-04-19 ENCOUNTER — Ambulatory Visit (INDEPENDENT_AMBULATORY_CARE_PROVIDER_SITE_OTHER): Payer: BC Managed Care – PPO

## 2020-04-19 DIAGNOSIS — I480 Paroxysmal atrial fibrillation: Secondary | ICD-10-CM | POA: Diagnosis not present

## 2020-04-19 LAB — CUP PACEART REMOTE DEVICE CHECK
Date Time Interrogation Session: 20220108231152
Implantable Pulse Generator Implant Date: 20190307

## 2020-05-04 NOTE — Progress Notes (Signed)
Carelink Summary Report / Loop Recorder 

## 2020-05-22 LAB — CUP PACEART REMOTE DEVICE CHECK
Date Time Interrogation Session: 20220210232928
Implantable Pulse Generator Implant Date: 20190307

## 2020-05-24 ENCOUNTER — Ambulatory Visit (INDEPENDENT_AMBULATORY_CARE_PROVIDER_SITE_OTHER): Payer: BC Managed Care – PPO

## 2020-05-24 ENCOUNTER — Telehealth: Payer: Self-pay | Admitting: Family Medicine

## 2020-05-24 DIAGNOSIS — I480 Paroxysmal atrial fibrillation: Secondary | ICD-10-CM | POA: Diagnosis not present

## 2020-05-24 NOTE — Telephone Encounter (Signed)
Pt's wife, Santiago Glad, called and states that patient is needing colonoscopy due to his age  Did see where we ordered colo guard at 2021 CPE She sates that he did that twice and each time the package arrived too late to the facility She states that enough time has passed and he now just needs colonscopy

## 2020-05-24 NOTE — Telephone Encounter (Signed)
Check with the patient concerning this and if he wants to get a colonoscopy go ahead and order it.  I would rather hear that from him not her

## 2020-05-25 ENCOUNTER — Other Ambulatory Visit: Payer: Self-pay

## 2020-05-25 DIAGNOSIS — Z1211 Encounter for screening for malignant neoplasm of colon: Secondary | ICD-10-CM

## 2020-05-25 NOTE — Telephone Encounter (Signed)
Per pt order. [place]

## 2020-05-27 NOTE — Progress Notes (Signed)
Carelink Summary Report / Loop Recorder 

## 2020-06-01 ENCOUNTER — Encounter: Payer: Self-pay | Admitting: Family Medicine

## 2020-06-24 LAB — CUP PACEART REMOTE DEVICE CHECK
Date Time Interrogation Session: 20220316003108
Implantable Pulse Generator Implant Date: 20190307

## 2020-06-28 ENCOUNTER — Ambulatory Visit (INDEPENDENT_AMBULATORY_CARE_PROVIDER_SITE_OTHER): Payer: BC Managed Care – PPO

## 2020-06-28 DIAGNOSIS — I480 Paroxysmal atrial fibrillation: Secondary | ICD-10-CM

## 2020-07-05 NOTE — Progress Notes (Signed)
Carelink Summary Report / Loop Recorder 

## 2020-07-27 ENCOUNTER — Ambulatory Visit (AMBULATORY_SURGERY_CENTER): Payer: Self-pay

## 2020-07-27 ENCOUNTER — Other Ambulatory Visit: Payer: Self-pay

## 2020-07-27 VITALS — Ht 70.0 in | Wt 263.0 lb

## 2020-07-27 DIAGNOSIS — Z1211 Encounter for screening for malignant neoplasm of colon: Secondary | ICD-10-CM

## 2020-07-27 MED ORDER — NA SULFATE-K SULFATE-MG SULF 17.5-3.13-1.6 GM/177ML PO SOLN: 1.0000 | Freq: Once | ORAL | 0 refills | Status: AC

## 2020-07-27 NOTE — Progress Notes (Signed)

## 2020-07-28 LAB — CUP PACEART REMOTE DEVICE CHECK
Date Time Interrogation Session: 20220418010214
Implantable Pulse Generator Implant Date: 20190307

## 2020-08-02 ENCOUNTER — Ambulatory Visit (INDEPENDENT_AMBULATORY_CARE_PROVIDER_SITE_OTHER): Payer: BC Managed Care – PPO

## 2020-08-02 DIAGNOSIS — I480 Paroxysmal atrial fibrillation: Secondary | ICD-10-CM

## 2020-08-06 ENCOUNTER — Encounter: Payer: Self-pay | Admitting: Internal Medicine

## 2020-08-09 ENCOUNTER — Ambulatory Visit (AMBULATORY_SURGERY_CENTER): Payer: BC Managed Care – PPO | Admitting: Internal Medicine

## 2020-08-09 ENCOUNTER — Other Ambulatory Visit: Payer: Self-pay

## 2020-08-09 ENCOUNTER — Encounter: Payer: Self-pay | Admitting: Internal Medicine

## 2020-08-09 VITALS — BP 120/68 | HR 54 | Temp 98.6°F | Resp 11 | Ht 70.0 in | Wt 263.0 lb

## 2020-08-09 DIAGNOSIS — D120 Benign neoplasm of cecum: Secondary | ICD-10-CM

## 2020-08-09 DIAGNOSIS — K635 Polyp of colon: Secondary | ICD-10-CM | POA: Diagnosis not present

## 2020-08-09 DIAGNOSIS — Z1211 Encounter for screening for malignant neoplasm of colon: Secondary | ICD-10-CM

## 2020-08-09 MED ORDER — SODIUM CHLORIDE 0.9 % IV SOLN: 500.0000 mL | Freq: Once | INTRAVENOUS | Status: DC

## 2020-08-09 NOTE — Progress Notes (Signed)
VS by CW  Pt's states no medical or surgical changes since previsit or office visit.  

## 2020-08-09 NOTE — Patient Instructions (Signed)
Information on polyps and diverticulosis given to you today.  Await pathology results.  Resume previous diet and medications.  YOU HAD AN ENDOSCOPIC PROCEDURE TODAY AT THE [facility]:   Refer to the procedure report that was given to you for any specific questions about what was found during the examination.  If the procedure report does not answer your questions, please call your gastroenterologist to clarify.  If you requested that your care partner not be given the details of your procedure findings, then the procedure report has been included in a sealed envelope for you to review at your convenience later.  YOU SHOULD EXPECT: Some feelings of bloating in the abdomen. Passage of more gas than usual.  Walking can help get rid of the air that was put into your GI tract during the procedure and reduce the bloating. If you had a lower endoscopy (such as a colonoscopy or flexible sigmoidoscopy) you may notice spotting of blood in your stool or on the toilet paper. If you underwent a bowel prep for your procedure, you may not have a normal bowel movement for a few days.  Please Note:  You might notice some irritation and congestion in your nose or some drainage.  This is from the oxygen used during your procedure.  There is no need for concern and it should clear up in a day or so.  SYMPTOMS TO REPORT IMMEDIATELY:   Following lower endoscopy (colonoscopy or flexible sigmoidoscopy):  Excessive amounts of blood in the stool  Significant tenderness or worsening of abdominal pains  Swelling of the abdomen that is new, acute  Fever of 100F or higher   For urgent or emergent issues, a gastroenterologist can be reached at any hour by calling [phone]. Do not use MyChart messaging for urgent concerns.    DIET:  We do recommend a small meal at first, but then you may proceed to your regular diet.  Drink plenty of fluids but you should avoid alcoholic beverages for 24  hours.  ACTIVITY:  You should plan to take it easy for the rest of today and you should NOT DRIVE or use heavy machinery until tomorrow (because of the sedation medicines used during the test).    FOLLOW UP: Our staff will call the number listed on your records 48-72 hours following your procedure to check on you and address any questions or concerns that you may have regarding the information given to you following your procedure. If we do not reach you, we will leave a message.  We will attempt to reach you two times.  During this call, we will ask if you have developed any symptoms of COVID 19. If you develop any symptoms (ie: fever, flu-like symptoms, shortness of breath, cough etc.) before then, please call [phone].  If you test positive for Covid 19 in the 2 weeks post procedure, please call and report this information to us.    If any biopsies were taken you will be contacted by phone or by letter within the next 1-3 weeks.  Please call us at [phone] if you have not heard about the biopsies in 3 weeks.    SIGNATURES/CONFIDENTIALITY: You and/or your care partner have signed paperwork which will be entered into your electronic medical record.  These signatures attest to the fact that that the information above on your After Visit Summary has been reviewed and is understood.  Full responsibility of the confidentiality of this discharge information lies with you and/or   your care-partner. 

## 2020-08-09 NOTE — Progress Notes (Signed)
To PACU, VSS. Report to [email or web address] 

## 2020-08-09 NOTE — Progress Notes (Signed)
Called to room to assist during endoscopic procedure.  Patient ID and intended procedure confirmed with present staff. Received instructions for my participation in the procedure from the performing physician.  

## 2020-08-09 NOTE — Op Note (Signed)
[facility] Patient Name: Alan Thompson Procedure Date: 08/09/2020 11:31 AM MRN: [id number] Endoscopist: Jerene Bears , MD Age: 55 Referring MD:  Date of Birth: 06/12/1965 Gender: Male Account #: [id number] Procedure:                Colonoscopy Indications:              Screening for colorectal malignant neoplasm, This                            is the patient's first colonoscopy Medicines:                Monitored Anesthesia Care Procedure:                Pre-Anesthesia Assessment:                           - Prior to the procedure, a History and Physical                            was performed, and patient medications and                            allergies were reviewed. The patient's tolerance of                            previous anesthesia was also reviewed. The risks                            and benefits of the procedure and the sedation                            options and risks were discussed with the patient.                            All questions were answered, and informed consent                            was obtained. Prior Anticoagulants: The patient has                            taken no previous anticoagulant or antiplatelet                            agents. ASA Grade Assessment: III - A patient with                            severe systemic disease. After reviewing the risks                            and benefits, the patient was deemed in                            satisfactory condition to undergo the procedure.  After obtaining informed consent, the colonoscope                            was passed under direct vision. Throughout the                            procedure, the patient's blood pressure, pulse, and                            oxygen saturations were monitored continuously. The                            Olympus CF-HQ190L [id number]) Colonoscope was                            introduced through the anus and  advanced to the                            cecum, identified by appendiceal orifice and                            ileocecal valve. The colonoscopy was performed                            without difficulty. The patient tolerated the                            procedure well. The quality of the bowel                            preparation was excellent. The ileocecal valve,                            appendiceal orifice, and rectum were photographed. Scope In: 11:45:05 AM Scope Out: 11:57:23 AM Scope Withdrawal Time: 0 hours 9 minutes 57 seconds  Total Procedure Duration: 0 hours 12 minutes 18 seconds  Findings:                 The digital rectal exam was normal.                           A 3 mm polyp was found in the cecum. The polyp was                            sessile. The polyp was removed with a cold snare.                            Resection and retrieval were complete.                           Multiple medium-mouthed diverticula were found in                            the sigmoid colon.  The exam was otherwise without abnormality on                            direct and retroflexion views. Complications:            No immediate complications. Estimated Blood Loss:     Estimated blood loss: none. Impression:               - One 3 mm polyp in the cecum, removed with a cold                            snare. Resected and retrieved.                           - Diverticulosis in the sigmoid colon.                           - The examination was otherwise normal on direct                            and retroflexion views. Recommendation:           - Patient has a contact number available for                            emergencies. The signs and symptoms of potential                            delayed complications were discussed with the                            patient. Return to normal activities tomorrow.                            Written discharge  instructions were provided to the                            patient.                           - Resume previous diet.                           - Continue present medications.                           - Await pathology results.                           - Repeat colonoscopy is recommended. The                            colonoscopy date will be determined after pathology                            results from today's exam become available for  review. Jerene Bears, MD 08/09/2020 12:00:23 PM This report has been signed electronically.

## 2020-08-11 ENCOUNTER — Encounter: Payer: 59 | Admitting: Family Medicine

## 2020-08-11 ENCOUNTER — Telehealth: Payer: Self-pay | Admitting: *Deleted

## 2020-08-11 NOTE — Telephone Encounter (Signed)
  Follow up Call-  Call back number 08/09/2020  Post procedure Call Back phone  # [phone]  Permission to leave phone message Yes  Some recent data might be hidden     Patient questions:  Do you have a fever, pain , or abdominal swelling? No. Pain Score  0 *  Have you tolerated food without any problems? Yes.    Have you been able to return to your normal activities? Yes.    Do you have any questions about your discharge instructions: Diet   No. Medications  No. Follow up visit  No.  Do you have questions or concerns about your Care? No.  Actions: * If pain score is 4 or above: No action needed, pain <4.  1. Have you developed a fever since your procedure? no  2.   Have you had an respiratory symptoms (SOB or cough) since your procedure? no  3.   Have you tested positive for COVID 19 since your procedure no  4.   Have you had any family members/close contacts diagnosed with the COVID 19 since your procedure?  no   If yes to any of these questions please route to Joylene John, RN and Joella Prince, RN

## 2020-08-12 ENCOUNTER — Encounter: Payer: 59 | Admitting: Family Medicine

## 2020-08-16 ENCOUNTER — Encounter: Payer: Self-pay | Admitting: Internal Medicine

## 2020-08-19 NOTE — Progress Notes (Signed)
Carelink Summary Report / Loop Recorder 

## 2020-08-31 ENCOUNTER — Ambulatory Visit: Payer: BC Managed Care – PPO | Admitting: Cardiology

## 2020-08-31 ENCOUNTER — Telehealth: Payer: Self-pay | Admitting: *Deleted

## 2020-08-31 ENCOUNTER — Encounter: Payer: Self-pay | Admitting: Cardiology

## 2020-08-31 ENCOUNTER — Other Ambulatory Visit: Payer: Self-pay

## 2020-08-31 ENCOUNTER — Ambulatory Visit (INDEPENDENT_AMBULATORY_CARE_PROVIDER_SITE_OTHER): Payer: BC Managed Care – PPO

## 2020-08-31 VITALS — BP 100/70 | HR 68 | Ht 70.0 in | Wt 264.2 lb

## 2020-08-31 DIAGNOSIS — I480 Paroxysmal atrial fibrillation: Secondary | ICD-10-CM

## 2020-08-31 DIAGNOSIS — G4733 Obstructive sleep apnea (adult) (pediatric): Secondary | ICD-10-CM | POA: Diagnosis not present

## 2020-08-31 NOTE — Progress Notes (Signed)
Cardiology Office Note    Date:  08/31/2020   ID:  Alan Thompson, DOB May 29, 1965, MRN [id number]  PCP:  Denita Lung, MD  Cardiologist:  Fransico Him, MD   Chief Complaint  Patient presents with  . Sleep Apnea  . Atrial Fibrillation    History of Present Illness:  Alan Thompson is a 55 y.o. male who presents for follwoup of OSA.  He has severe OSA with an AHI of 54/hr. Oxygen saturations dropped to 64%. He is now on BiPAP at 17/13cm H2O.   He also has PAF and has been maintaining NSR.   He is doing well with his BiPAP device and thinks that he has gotten used to it.  He tolerates the mask and feels the pressure is adequate.  Since going on BiPAP he feels rested in the am and has no significant daytime sleepiness.  He denies any significant mouth or nasal dryness or nasal congestion.  He does not think that he snores.  He says that his machine is at least 53 years old and is now making a loud noise making it difficult to sleep.  It also is no longer recording results recently.    He is here today for followup and is doing well.  He denies any chest pain or pressure, SOB, DOE, PND, orthopnea, LE edema, dizziness, palpitations or syncope. He is compliant with his meds and is tolerating meds with no SE.    Past Medical History:  Diagnosis Date  . Allergy    RHINITIS  . Arthritis   . Asthma   . GERD (gastroesophageal reflux disease)   . H/O hiatal hernia   . Hyperlipidemia   . OSA (obstructive sleep apnea)    uses BiPAP  . Persistent atrial fibrillation ([place])   . Sleep apnea 2016   bipap    Past Surgical History:  Procedure Laterality Date  . ANTERIOR CRUCIATE LIGAMENT REPAIR     BILATERAL  . ATRIAL FIBRILLATION ABLATION  06/2017   PVI at [facility] N/A 07/08/2013   Procedure: CARDIOVERSION;  Surgeon: [name], MD;  Location: [facility];  Service: Cardiovascular;  Laterality: N/A;  . CHOLECYSTECTOMY  2010  . implantable loop recorder   06/01/2017   MDT Reveal LINQ implanted by Dr Jessy Oto in [place]  2010    Current Medications: Outpatient Medications Prior to Visit  Medication Sig Dispense Refill  . aspirin EC 81 MG tablet Take 1 tablet (81 mg total) by mouth daily. 90 tablet 3  . cetirizine (ZYRTEC) 10 MG tablet Take 10 mg by mouth daily as needed for allergies or rhinitis.     . fluticasone (FLONASE) 50 MCG/ACT nasal spray Place 2 sprays into both nostrils daily as needed for allergies or rhinitis.    . Multiple Vitamin (MULTIVITAMIN ADULT PO) Take by mouth.    Alan Thompson olopatadine (PATANOL) 0.1 % ophthalmic solution Place 1 drop into both eyes 2 (two) times daily. (Patient taking differently: Place 1 drop into both eyes 2 (two) times daily as needed for allergies.) 5 mL 12  . omeprazole (PRILOSEC) 40 MG capsule TAKE 1 CAPSULE BY MOUTH EVERY DAY 90 capsule 0  . clindamycin (CLEOCIN T) 1 % lotion Apply 1 application topically 2 (two) times daily. Apply to affected area (Patient not taking: Reported on 08/31/2020) 60 mL 5  . clobetasol (CORMAX SCALP APPLICATION) 9.38 % external solution Apply 1 application topically 2 (two)  times daily. (Patient not taking: Reported on 08/31/2020) 50 mL 0  . clobetasol (TEMOVATE) 0.05 % external solution Apply 1 application topically 2 (two) times daily. (Patient not taking: Reported on 08/31/2020)    . mometasone (ASMANEX) 220 MCG/INH inhaler Inhale 2 puffs into the lungs daily as needed (shortness or breath or wheezing). (Patient not taking: Reported on 08/31/2020) 1 Inhaler 5   Facility-Administered Medications Prior to Visit  Medication Dose Route Frequency Provider Last Rate Last Admin  . 0.9 %  sodium chloride infusion  500 mL Intravenous Once Pyrtle, Lajuan Lines, MD         Allergies:   Patient has no known allergies.   Social History   Socioeconomic History  . Marital status: Married    Spouse name: Not on file  . Number of children: Not on file  . Years of education: Not  on file  . Highest education level: Not on file  Occupational History  . Not on file  Tobacco Use  . Smoking status: Former Smoker    Packs/day: 0.50    Years: 10.00    Pack years: 5.00    Types: Cigarettes    Quit date: 1999    Years since quitting: 23.4  . Smokeless tobacco: Never Used  Vaping Use  . Vaping Use: Never used  Substance and Sexual Activity  . Alcohol use: Yes    Alcohol/week: 1.0 standard drink    Types: 1 Standard drinks or equivalent per week  . Drug use: No  . Sexual activity: Yes  Other Topics Concern  . Not on file  Social History Narrative  . Not on file   Social Determinants of Health   Financial Resource Strain: Not on file  Food Insecurity: Not on file  Transportation Needs: Not on file  Physical Activity: Not on file  Stress: Not on file  Social Connections: Not on file     Family History:  The patient's family history includes Arrhythmia in his mother; Prostate cancer in his father.   ROS:   Please see the history of present illness.    ROS All other systems reviewed and are negative.  No flowsheet data found.     PHYSICAL EXAM:   VS:  BP 100/70   Pulse 68   Ht 5\' 10"  (1.778 m)   Wt 264 lb 3.2 oz (119.8 kg)   SpO2 96%   BMI 37.91 kg/m    GEN: Well nourished, well developed in no acute distress HEENT: Normal NECK: No JVD; No carotid bruits LYMPHATICS: No lymphadenopathy CARDIAC:RRR, no murmurs, rubs, gallops RESPIRATORY:  Clear to auscultation without rales, wheezing or rhonchi  ABDOMEN: Soft, non-tender, non-distended MUSCULOSKELETAL:  No edema; No deformity  SKIN: Warm and dry NEUROLOGIC:  Alert and oriented x 3 PSYCHIATRIC:  Normal affect    Wt Readings from Last 3 Encounters:  08/31/20 264 lb 3.2 oz (119.8 kg)  08/09/20 263 lb (119.3 kg)  07/27/20 263 lb (119.3 kg)      Studies/Labs Reviewed:   EKG:  EKG is not ordered today.    Recent Labs: No results found for requested labs within last 8760 hours.    Lipid Panel    Component Value Date/Time   CHOL 254 (H) 08/11/2019 1516   TRIG 164 (H) 08/11/2019 1516   HDL 49 08/11/2019 1516   CHOLHDL 5.2 (H) 08/11/2019 1516   CHOLHDL 4.9 11/16/2016 1520   VLDL 36 (H) 11/16/2016 1520   LDLCALC 175 (H) 08/11/2019 1516  Additional studies/ records that were reviewed today include:  BiPAP download    ASSESSMENT:    1. Obstructive sleep apnea   2. Morbid obesity ([place])   3. Paroxysmal atrial fibrillation ([place])      PLAN:  In order of problems listed above:  1.  OSA - The patient is tolerating PAP therapy well without any problems. The PAP download performed by his DME was personally reviewed and interpreted by me today and showed an AHI of 1.2/hr on 17/13 cm H2O with 93% compliance in using more than 4 hours nightly.  The patient has been using and benefiting from PAP use and will continue to benefit from therapy.  -I have sent in a new prescription for PAP management with a new ResMed BiPAP at 17/13cm H2O as his device is old and having problems working -I have sent in an order for PAP supplies and new mask to his DME\ -he will see me back in 6 weeks per insurance requirements  2.  Morbid Obesity -his BMI is over 37 and he has not been successful in weight loss -I have encouraged him to try to get into an exercise program -I will refer him to [facility] Well[facility] program at [facility]  3. PAF -S/P PVI in [place] score is 0 -he has not had any palpitations and is maintaining NSR -he has not required any AVN blocking agents   Medication Adjustments/Labs and Tests Ordered: Current medicines are reviewed at length with the patient today.  Concerns regarding medicines are outlined above.  Medication changes, Labs and Tests ordered today are listed in the Patient Instructions below.  There are no Patient Instructions on file for this visit.   Signed, Fransico Him, MD  08/31/2020 10:59 AM    [facility]  [facility], [place], [place]  [address] Phone: [phone]; Fax: [phone]

## 2020-08-31 NOTE — Patient Instructions (Signed)
Medication Instructions:  Your physician recommends that you continue on your current medications as directed. Please refer to the Current Medication list given to you today.  *If you need a refill on your cardiac medications before your next appointment, please call your pharmacy*  Follow-Up: At [facility], you and your health needs are our priority.  As part of our continuing mission to provide you with exceptional heart care, we have created designated Provider Care Teams.  These Care Teams include your primary Cardiologist (physician) and Advanced Practice Providers (APPs -  Physician Assistants and Nurse Practitioners) who all work together to provide you with the care you need, when you need it.  Your next appointment:   6 weeks after receiving your new machine  The format for your next appointment:   In Person  Provider:   Fransico Him, MD   Other Instructions You have been referred to the Healthy Weight and Wellness Program

## 2020-08-31 NOTE — Telephone Encounter (Signed)
-----   Message from Antonieta Iba, RN sent at 08/31/2020 11:13 AM EDT ----- Per Dr. Radford Pax please order a new ResMed BiPAP at 17/13cm H2O. Needs to be done ASAP due to his current machine malfunctioning.  Thanks!

## 2020-08-31 NOTE — Addendum Note (Signed)
Addended by: Antonieta Iba on: 08/31/2020 11:13 AM   Modules accepted: Orders

## 2020-08-31 NOTE — Telephone Encounter (Signed)
Order placed to [facility] to please order a new ResMed BiPAP at 17/13cm H2O.

## 2020-09-01 LAB — CUP PACEART REMOTE DEVICE CHECK
Date Time Interrogation Session: 20220521010114
Implantable Pulse Generator Implant Date: 20190307

## 2020-09-22 NOTE — Progress Notes (Signed)
Carelink Summary Report / Loop Recorder 

## 2020-09-30 LAB — CUP PACEART REMOTE DEVICE CHECK
Date Time Interrogation Session: 20220623010905
Implantable Pulse Generator Implant Date: 20190307

## 2020-10-04 ENCOUNTER — Ambulatory Visit (INDEPENDENT_AMBULATORY_CARE_PROVIDER_SITE_OTHER): Payer: BC Managed Care – PPO

## 2020-10-04 DIAGNOSIS — I480 Paroxysmal atrial fibrillation: Secondary | ICD-10-CM

## 2020-10-04 NOTE — Addendum Note (Signed)
Addended by: Douglass Rivers D on: 10/04/2020 04:20 PM   Modules accepted: Level of Service

## 2020-10-11 ENCOUNTER — Encounter (INDEPENDENT_AMBULATORY_CARE_PROVIDER_SITE_OTHER): Payer: Self-pay

## 2020-10-13 DIAGNOSIS — G4733 Obstructive sleep apnea (adult) (pediatric): Secondary | ICD-10-CM | POA: Diagnosis not present

## 2020-10-25 NOTE — Progress Notes (Signed)
Carelink Summary Report / Loop Recorder 

## 2020-11-04 ENCOUNTER — Ambulatory Visit (INDEPENDENT_AMBULATORY_CARE_PROVIDER_SITE_OTHER): Payer: BC Managed Care – PPO

## 2020-11-04 DIAGNOSIS — I480 Paroxysmal atrial fibrillation: Secondary | ICD-10-CM | POA: Diagnosis not present

## 2020-11-06 LAB — CUP PACEART REMOTE DEVICE CHECK
Date Time Interrogation Session: 20220726010927
Implantable Pulse Generator Implant Date: 20190307

## 2020-11-10 ENCOUNTER — Encounter: Payer: Self-pay | Admitting: Family Medicine

## 2020-11-10 ENCOUNTER — Ambulatory Visit (INDEPENDENT_AMBULATORY_CARE_PROVIDER_SITE_OTHER): Payer: BC Managed Care – PPO | Admitting: Family Medicine

## 2020-11-10 ENCOUNTER — Other Ambulatory Visit: Payer: Self-pay

## 2020-11-10 VITALS — BP 106/70 | HR 54 | Temp 97.6°F | Ht 70.0 in | Wt 259.2 lb

## 2020-11-10 DIAGNOSIS — E669 Obesity, unspecified: Secondary | ICD-10-CM | POA: Diagnosis not present

## 2020-11-10 DIAGNOSIS — Z1159 Encounter for screening for other viral diseases: Secondary | ICD-10-CM | POA: Diagnosis not present

## 2020-11-10 DIAGNOSIS — Z8679 Personal history of other diseases of the circulatory system: Secondary | ICD-10-CM

## 2020-11-10 DIAGNOSIS — Z96652 Presence of left artificial knee joint: Secondary | ICD-10-CM

## 2020-11-10 DIAGNOSIS — Z23 Encounter for immunization: Secondary | ICD-10-CM | POA: Diagnosis not present

## 2020-11-10 DIAGNOSIS — I340 Nonrheumatic mitral (valve) insufficiency: Secondary | ICD-10-CM

## 2020-11-10 DIAGNOSIS — J301 Allergic rhinitis due to pollen: Secondary | ICD-10-CM

## 2020-11-10 DIAGNOSIS — G4733 Obstructive sleep apnea (adult) (pediatric): Secondary | ICD-10-CM

## 2020-11-10 DIAGNOSIS — K219 Gastro-esophageal reflux disease without esophagitis: Secondary | ICD-10-CM

## 2020-11-10 DIAGNOSIS — M1710 Unilateral primary osteoarthritis, unspecified knee: Secondary | ICD-10-CM

## 2020-11-10 DIAGNOSIS — Z0000 Encounter for general adult medical examination without abnormal findings: Secondary | ICD-10-CM | POA: Diagnosis not present

## 2020-11-10 DIAGNOSIS — J4520 Mild intermittent asthma, uncomplicated: Secondary | ICD-10-CM

## 2020-11-10 DIAGNOSIS — Z87891 Personal history of nicotine dependence: Secondary | ICD-10-CM

## 2020-11-10 NOTE — Progress Notes (Signed)
   Subjective:    Patient ID: Alan Thompson, male    DOB: [birth year], 55 y.o.   MRN: [id number]  HPI He is here for complete examination.  He does have a history of atrial fibrillation and has had an ablation.  He still has a loop recorder in however is not on any medications for that.  Did have a previous echocardiogram which did show evidence of MR but presently is having no symptoms.  He does follow-up periodically with cardiology.  He has had a recent colonoscopy which was negative.  Does have reflux symptoms and is presently using omeprazole.  He does note a certain [employer] at work.  He has springtime allergies and does occasionally have difficulty with asthma during that same timeframe.  He has had left TKR and is having difficulty with his right knee but not quite ready for surgery.  Does not smoke or drink.  His marriage is going well.  He recently had his son come out as transgender and is now presenting as male.  His work and home life are going well.  Otherwise his family and social history as well as health maintenance and immunizations was reviewed. Review of Systems  All other systems reviewed and are negative.     Objective:   Physical Exam Alert and in no distress. Tympanic membranes and canals are normal. Pharyngeal area is normal. Neck is supple without adenopathy or thyromegaly. Cardiac exam shows a regular sinus rhythm without murmurs or gallops. Lungs are clear to auscultation. Abdominal exam shows no masses or tenderness.       Assessment & Plan:   Routine general medical examination at a health care facility  OSA treated with BiPAP  Obesity (BMI 30.0-34.9)  History of atrial fibrillation  Seasonal allergic rhinitis due to pollen  Former smoker  Mild intermittent asthma in adult without complication  Gastroesophageal reflux disease without esophagitis  Mitral valve insufficiency, unspecified etiology  Need for shingles vaccine  Need for hepatitis C  screening test  Arthritis of knee  Status post total left knee replacement He will continue on his CPAP as he is doing quite nicely on that.  Not having any difficulty from the atrial fibrillation and apparently the pacer couple be taken out when the battery runs out.  He will continue to treat his allergies as needed.  Discussed follow-up scanning because of the history of smoking.  He will call when he needs a refill on his inhaler.  Discussed the usage of an H2 blocker instead of the Prilosec for his reflux symptoms.  I then discussed his knee pain.  He has been through a TKR so he knows that when he gets to the point that if unacceptable, he can get the surgery.

## 2020-11-11 LAB — CBC WITH DIFFERENTIAL/PLATELET
Basophils Absolute: 0 10*3/uL (ref 0.0–0.2)
Basos: 1 %
EOS (ABSOLUTE): 0.3 10*3/uL (ref 0.0–0.4)
Eos: 5 %
Hematocrit: 42.3 % (ref 37.5–51.0)
Hemoglobin: 14.7 g/dL (ref 13.0–17.7)
Immature Grans (Abs): 0 10*3/uL (ref 0.0–0.1)
Immature Granulocytes: 0 %
Lymphocytes Absolute: 2.3 10*3/uL (ref 0.7–3.1)
Lymphs: 44 %
MCH: 31.8 pg (ref 26.6–33.0)
MCHC: 34.8 g/dL (ref 31.5–35.7)
MCV: 92 fL (ref 79–97)
Monocytes Absolute: 0.4 10*3/uL (ref 0.1–0.9)
Monocytes: 7 %
Neutrophils Absolute: 2.3 10*3/uL (ref 1.4–7.0)
Neutrophils: 43 %
Platelets: 212 10*3/uL (ref 150–450)
RBC: 4.62 x10E6/uL (ref 4.14–5.80)
RDW: 11.9 % (ref 11.6–15.4)
WBC: 5.3 10*3/uL (ref 3.4–10.8)

## 2020-11-11 LAB — COMPREHENSIVE METABOLIC PANEL
ALT: 26 IU/L (ref 0–44)
AST: 28 IU/L (ref 0–40)
Albumin/Globulin Ratio: 1.8 (ref 1.2–2.2)
Albumin: 4.9 g/dL (ref 3.8–4.9)
Alkaline Phosphatase: 52 IU/L (ref 44–121)
BUN/Creatinine Ratio: 14 (ref 9–20)
BUN: 16 mg/dL (ref 6–24)
Bilirubin Total: 1.3 mg/dL — ABNORMAL HIGH (ref 0.0–1.2)
CO2: 22 mmol/L (ref 20–29)
Calcium: 9.8 mg/dL (ref 8.7–10.2)
Chloride: 102 mmol/L (ref 96–106)
Creatinine, Ser: 1.12 mg/dL (ref 0.76–1.27)
Globulin, Total: 2.8 g/dL (ref 1.5–4.5)
Glucose: 87 mg/dL (ref 65–99)
Potassium: 4.6 mmol/L (ref 3.5–5.2)
Sodium: 140 mmol/L (ref 134–144)
Total Protein: 7.7 g/dL (ref 6.0–8.5)
eGFR: 79 mL/min/{1.73_m2} (ref >59)

## 2020-11-11 LAB — LIPID PANEL
Chol/HDL Ratio: 5.7 ratio — ABNORMAL HIGH (ref 0.0–5.0)
Cholesterol, Total: 287 mg/dL — ABNORMAL HIGH (ref 100–199)
HDL: 50 mg/dL (ref >39)
LDL Chol Calc (NIH): 203 mg/dL — ABNORMAL HIGH (ref 0–99)
Triglycerides: 181 mg/dL — ABNORMAL HIGH (ref 0–149)
VLDL Cholesterol Cal: 34 mg/dL (ref 5–40)

## 2020-11-11 LAB — HEPATITIS C ANTIBODY: Hep C Virus Ab: 0.3 s/co ratio (ref 0.0–0.9)

## 2020-11-13 DIAGNOSIS — G4733 Obstructive sleep apnea (adult) (pediatric): Secondary | ICD-10-CM | POA: Diagnosis not present

## 2020-11-30 NOTE — Progress Notes (Signed)
Carelink Summary Report / Loop Recorder 

## 2020-12-07 ENCOUNTER — Ambulatory Visit (INDEPENDENT_AMBULATORY_CARE_PROVIDER_SITE_OTHER): Payer: BC Managed Care – PPO

## 2020-12-07 DIAGNOSIS — I480 Paroxysmal atrial fibrillation: Secondary | ICD-10-CM | POA: Diagnosis not present

## 2020-12-07 LAB — CUP PACEART REMOTE DEVICE CHECK
Date Time Interrogation Session: 20220828011400
Implantable Pulse Generator Implant Date: 20190307

## 2020-12-14 DIAGNOSIS — G4733 Obstructive sleep apnea (adult) (pediatric): Secondary | ICD-10-CM | POA: Diagnosis not present

## 2020-12-20 NOTE — Progress Notes (Signed)
Carelink Summary Report / Loop Recorder 

## 2021-01-10 ENCOUNTER — Ambulatory Visit (INDEPENDENT_AMBULATORY_CARE_PROVIDER_SITE_OTHER): Payer: BC Managed Care – PPO

## 2021-01-10 DIAGNOSIS — I480 Paroxysmal atrial fibrillation: Secondary | ICD-10-CM | POA: Diagnosis not present

## 2021-01-12 LAB — CUP PACEART REMOTE DEVICE CHECK
Date Time Interrogation Session: 20220930011609
Implantable Pulse Generator Implant Date: 20190307

## 2021-01-13 DIAGNOSIS — G4733 Obstructive sleep apnea (adult) (pediatric): Secondary | ICD-10-CM | POA: Diagnosis not present

## 2021-01-18 NOTE — Progress Notes (Signed)
Carelink Summary Report / Loop Recorder 

## 2021-02-09 LAB — CUP PACEART REMOTE DEVICE CHECK
Date Time Interrogation Session: 20221102012118
Implantable Pulse Generator Implant Date: 20190307

## 2021-02-13 DIAGNOSIS — G4733 Obstructive sleep apnea (adult) (pediatric): Secondary | ICD-10-CM | POA: Diagnosis not present

## 2021-02-14 ENCOUNTER — Ambulatory Visit (INDEPENDENT_AMBULATORY_CARE_PROVIDER_SITE_OTHER): Payer: BC Managed Care – PPO

## 2021-02-14 DIAGNOSIS — I480 Paroxysmal atrial fibrillation: Secondary | ICD-10-CM

## 2021-02-18 NOTE — Progress Notes (Signed)
Carelink Summary Report / Loop Recorder 

## 2021-03-10 ENCOUNTER — Telehealth: Payer: Self-pay

## 2021-03-10 NOTE — Telephone Encounter (Signed)
ILR reached RRT 02/26/21. Attempted to contact patient to advise. No answer, LMTCB.  Canceled future remotes/ marked "I" in [employer]/taken out of carelink.

## 2021-03-15 DIAGNOSIS — G4733 Obstructive sleep apnea (adult) (pediatric): Secondary | ICD-10-CM | POA: Diagnosis not present

## 2021-03-16 ENCOUNTER — Encounter: Payer: Self-pay | Admitting: Family Medicine

## 2021-03-17 ENCOUNTER — Other Ambulatory Visit: Payer: Self-pay

## 2021-03-17 DIAGNOSIS — K219 Gastro-esophageal reflux disease without esophagitis: Secondary | ICD-10-CM

## 2021-03-17 NOTE — Telephone Encounter (Signed)
Done KH 

## 2021-03-18 NOTE — Telephone Encounter (Signed)
Spoke with patient.  Advised of ILR battery.  He will unplug monitor.  He would like appt with Dr. Rayann Heman to remove implant and discuss if a new implant is needed.    Advised I would have scheduling contact him to set up visit.

## 2021-04-07 NOTE — Telephone Encounter (Signed)
This encounter was created in error - please disregard.

## 2021-04-15 DIAGNOSIS — G4733 Obstructive sleep apnea (adult) (pediatric): Secondary | ICD-10-CM | POA: Diagnosis not present

## 2021-04-22 DIAGNOSIS — G4733 Obstructive sleep apnea (adult) (pediatric): Secondary | ICD-10-CM | POA: Diagnosis not present

## 2021-05-05 ENCOUNTER — Encounter: Payer: Self-pay | Admitting: Family Medicine

## 2021-05-05 ENCOUNTER — Telehealth (INDEPENDENT_AMBULATORY_CARE_PROVIDER_SITE_OTHER): Payer: BC Managed Care – PPO | Admitting: Family Medicine

## 2021-05-05 ENCOUNTER — Other Ambulatory Visit: Payer: Self-pay

## 2021-05-05 VITALS — Ht 70.0 in | Wt 249.0 lb

## 2021-05-05 DIAGNOSIS — K219 Gastro-esophageal reflux disease without esophagitis: Secondary | ICD-10-CM | POA: Diagnosis not present

## 2021-05-05 NOTE — Progress Notes (Signed)
° °  Subjective:    Patient ID: FERNANDO STOIBER, male    DOB: [birth year], 56 y.o.   MRN: [id number]  HPI Documentation for virtual audio and video telecommunications through [place] encounter: The patient was located at home. 2 patient identifiers used.  The provider was located in the office. The patient did consent to this visit and is aware of possible charges through their insurance for this visit. The other persons participating in this telemedicine service were none. Time spent on call was 5 minutes and in review of previous records >18 minutes total for counseling and coordination of care. This virtual service is not related to other E/M service within previous 7 days.  Today's visit is to discuss reflux.  He has a history of GERD with diagnosis of a hiatus hernia several years ago.  He was seen by GI at that time.  He apparently has done well until recently and now complains of the burning and queasy sensation in his abdominal area.  He has been taking Prilosec 20 mg without much success.  Review of Systems     Objective:   Physical Exam Alert and in no distress otherwise not examined       Assessment & Plan:  Gastroesophageal reflux disease without esophagitis Recommend he increase the Prilosec to 40 mg for 2 weeks and if no benefits, double it to 40 mg twice per day.  If he still has difficulty after that, further GI evaluation would be needed.

## 2021-05-16 DIAGNOSIS — G4733 Obstructive sleep apnea (adult) (pediatric): Secondary | ICD-10-CM | POA: Diagnosis not present

## 2021-05-27 ENCOUNTER — Encounter: Payer: BC Managed Care – PPO | Admitting: Internal Medicine

## 2021-06-02 ENCOUNTER — Encounter: Payer: Self-pay | Admitting: Internal Medicine

## 2021-06-03 ENCOUNTER — Inpatient Hospital Stay: Admission: RE | Admit: 2021-06-03 | Payer: BC Managed Care – PPO | Source: Ambulatory Visit

## 2021-06-03 ENCOUNTER — Encounter: Payer: Self-pay | Admitting: Family Medicine

## 2021-06-03 ENCOUNTER — Ambulatory Visit (HOSPITAL_COMMUNITY)
Admission: EM | Admit: 2021-06-03 | Discharge: 2021-06-03 | Disposition: A | Discharge status: Home / Self Care | Payer: BC Managed Care – PPO

## 2021-06-03 ENCOUNTER — Other Ambulatory Visit: Payer: Self-pay

## 2021-06-06 ENCOUNTER — Encounter: Payer: Self-pay | Admitting: Medical

## 2021-06-06 ENCOUNTER — Telehealth (INDEPENDENT_AMBULATORY_CARE_PROVIDER_SITE_OTHER): Payer: BC Managed Care – PPO | Admitting: Medical

## 2021-06-06 ENCOUNTER — Other Ambulatory Visit: Payer: Self-pay

## 2021-06-06 VITALS — BP 118/80 | Temp 98.7°F | Wt 249.0 lb

## 2021-06-06 DIAGNOSIS — R052 Subacute cough: Secondary | ICD-10-CM | POA: Diagnosis not present

## 2021-06-06 DIAGNOSIS — J988 Other specified respiratory disorders: Secondary | ICD-10-CM

## 2021-06-06 DIAGNOSIS — Z8679 Personal history of other diseases of the circulatory system: Secondary | ICD-10-CM | POA: Diagnosis not present

## 2021-06-06 NOTE — Progress Notes (Signed)
Subjective:     Patient ID: Alan Thompson, male   DOB: November 02, 1965, 56 y.o.   MRN: [id number]  This visit type was conducted due to national recommendations for restrictions regarding the COVID-19 Pandemic (e.g. social distancing) in an effort to limit this patient's exposure and mitigate transmission in our community.  Due to their co-morbid illnesses, this patient is at least at moderate risk for complications without adequate follow up.  This format is felt to be most appropriate for this patient at this time.    Documentation for virtual audio and video telecommunications through [place] encounter:  The patient was located at home. The provider was located in the office. The patient did consent to this visit and is aware of possible charges through their insurance for this visit.  The other persons participating in this telemedicine service were none. Time spent on call was 20 minutes and in review of previous records 20 minutes total.  This virtual service is not related to other E/M service within previous 7 days.   HPI Chief Complaint  Patient presents with   Cough   Sinus Problem    And congestion. Started 05/24/21  Covid , flu and strep test all negative as of Friday at urgent care    Virtual consult for illness.  He notes symptoms began 05/24/21 with cough and illness, cough, sore throat.  Within a few days body aches, sore throat, congestion, fever that lasted about a week.   Was improving over the week, tylenol was helping the fever.  Within a few days started getting ill again.   He notes hx/o afib, hx/o ablation, but last week his heart rate monitor suggested afib for about 18 hours.   Then went back into sinus rhythm.    He contacted cardiology who felt his illness triggered the afib.    Has currently sinus pressure, post nasal drip, heavy wet cough, and after few days of recurrent symptoms after over a week, went to urgent care.  Was given steroid/prednisone x 5 days,  advised afrin, neti pot, and switched to different cough and cold medicaiton, Flonase.   Was given albuterol as well. Was given antibiotic, but was advised not to take this unless he became febrile again.   Has felt a little better over this past weekend.    Used albuterol about every 6 hours during the day.  Hasn't felt particularly wheezy or sob.    No chest xray was done at urgent care.    Past Medical History:  Diagnosis Date   Allergy    RHINITIS   Arthritis    Asthma    GERD (gastroesophageal reflux disease)    H/O hiatal hernia    Hyperlipidemia    OSA (obstructive sleep apnea)    uses BiPAP   Persistent atrial fibrillation ([place])    Sleep apnea 2016   bipap   Current Outpatient Medications on File Prior to Visit  Medication Sig Dispense Refill   albuterol (VENTOLIN HFA) 108 (90 Base) MCG/ACT inhaler PLEASE SEE ATTACHED FOR DETAILED DIRECTIONS     aspirin EC 81 MG tablet Take 1 tablet (81 mg total) by mouth daily. 90 tablet 3   Multiple Vitamin (MULTIVITAMIN ADULT PO) Take by mouth.     omeprazole (PRILOSEC) 40 MG capsule TAKE 1 CAPSULE BY MOUTH EVERY DAY 90 capsule 0   predniSONE (DELTASONE) 20 MG tablet Take 40 mg by mouth every morning.     amoxicillin (AMOXIL) 500 MG capsule Take 500 mg  by mouth every 8 (eight) hours. (Patient not taking: Reported on 06/06/2021)     cetirizine (ZYRTEC) 10 MG tablet Take 10 mg by mouth daily as needed for allergies or rhinitis.  (Patient not taking: Reported on 11/10/2020)     fluticasone (FLONASE) 50 MCG/ACT nasal spray Place 2 sprays into both nostrils daily as needed for allergies or rhinitis. (Patient not taking: Reported on 11/10/2020)     olopatadine (PATANOL) 0.1 % ophthalmic solution Place 1 drop into both eyes 2 (two) times daily. (Patient not taking: Reported on 05/05/2021) 5 mL 12   Current Facility-Administered Medications on File Prior to Visit  Medication Dose Route Frequency Provider Last Rate Last Admin   0.9 %  sodium chloride  infusion  500 mL Intravenous Once Pyrtle, Lajuan Lines, MD        Review of Systems As in subjective    Objective:   Physical Exam Due to coronavirus pandemic stay at home measures, patient visit was virtual and they were not examined in person.   BP 118/80    Temp 98.7 F (37.1 C)    Wt 249 lb (112.9 kg)    BMI 35.73 kg/m   Gen: wd,wn, nad No labored breathing or witnessed wheezing     Assessment:     Encounter Diagnoses  Name Primary?   Respiratory tract infection Yes   Subacute cough    History of atrial fibrillation        Plan:     We discussed his symptoms and concerns.  He may have had 2 back-to-back different respiratory tract infections.  He seems to be on the improving side of things at this point.  He will finish out prednisone, rest, hydration as discussed, continue Tylenol as needed, continue nasal saline and salt water gargles for another few days.  He has no current wheezing or shortness of breath so can either stop albuterol or can use to help get out mucus if he still feels a lot of chest congestion.  Can continue over-the-counter Robitussin-DM.  He already has an antibiotic but has not started this.  We discussed that if his sinus pressure continues or gets worse again or if he starts to get more thicker mucus then can go ahead and use the antibiotic he already has.  If further changes or no improvement in the next few days then consider chest x-ray and CBC.  I can place those orders if he calls back and wants to pursue this.  Regarding A-fib, he has a watch monitoring and notes back in sinus rhythm at this time.  I advised that he contact cardiology if this changes again back to A-fib   At this point it sounds like he is improving and may need to just continue his current regimen a few more days  Alan Thompson was seen today for cough and sinus problem.  Diagnoses and all orders for this visit:  Respiratory tract infection  Subacute cough  History of atrial  fibrillation  F/u prn

## 2021-06-13 DIAGNOSIS — G4733 Obstructive sleep apnea (adult) (pediatric): Secondary | ICD-10-CM | POA: Diagnosis not present

## 2021-06-30 DIAGNOSIS — K219 Gastro-esophageal reflux disease without esophagitis: Secondary | ICD-10-CM | POA: Diagnosis not present

## 2021-07-14 ENCOUNTER — Ambulatory Visit: Payer: BC Managed Care – PPO | Admitting: Internal Medicine

## 2021-07-14 ENCOUNTER — Encounter: Payer: Self-pay | Admitting: Internal Medicine

## 2021-07-14 VITALS — BP 126/84 | HR 62 | Ht 70.0 in | Wt 257.4 lb

## 2021-07-14 DIAGNOSIS — G4733 Obstructive sleep apnea (adult) (pediatric): Secondary | ICD-10-CM | POA: Diagnosis not present

## 2021-07-14 DIAGNOSIS — I4819 Other persistent atrial fibrillation: Secondary | ICD-10-CM

## 2021-07-14 HISTORY — PX: OTHER SURGICAL HISTORY: SHX169

## 2021-07-14 NOTE — Progress Notes (Signed)
? ?PCP: Denita Lung, MD ?  ?Primary EP: Dr Rayann Heman ? ?Alan Thompson is a 56 y.o. male who presents today for routine electrophysiology followup.  Since last being seen in our clinic, the patient reports doing very well.  He had a single episode of afib in feburary lasting < 12 hours in the setting of URI/ medical illness.  He has had no afib otherwise in the past year.  I did review his apple watch and confirmed this event.  Today, he denies symptoms of palpitations, chest pain, shortness of breath,  lower extremity edema, dizziness, presyncope, or syncope.  The patient is otherwise without complaint today.  ? ?Past Medical History:  ?Diagnosis Date  ? Allergy   ? RHINITIS  ? Arthritis   ? Asthma   ? GERD (gastroesophageal reflux disease)   ? H/O hiatal hernia   ? Hyperlipidemia   ? OSA (obstructive sleep apnea)   ? uses BiPAP  ? Persistent atrial fibrillation ([place])   ? Sleep apnea 2016  ? bipap  ? ?Past Surgical History:  ?Procedure Laterality Date  ? ANTERIOR CRUCIATE LIGAMENT REPAIR    ? BILATERAL  ? ATRIAL FIBRILLATION ABLATION  06/2017  ? PVI at [facility]  ? CARDIOVERSION N/A 07/08/2013  ? Procedure: CARDIOVERSION;  Surgeon: [name], MD;  Location: [facility];  Service: Cardiovascular;  Laterality: N/A;  ? CHOLECYSTECTOMY  2010  ? implantable loop recorder  06/01/2017  ? MDT Reveal LINQ implanted by Dr Jessy Oto in [place]  ? TOTAL KNEE ARTHROPLASTY  2010  ? ? ?ROS- all systems are reviewed and negatives except as per HPI above ? ?Current Outpatient Medications  ?Medication Sig Dispense Refill  ? albuterol (VENTOLIN HFA) 108 (90 Base) MCG/ACT inhaler PLEASE SEE ATTACHED FOR DETAILED DIRECTIONS    ? amoxicillin (AMOXIL) 500 MG capsule Take 500 mg by mouth every 8 (eight) hours.    ? aspirin EC 81 MG tablet Take 1 tablet (81 mg total) by mouth daily. 90 tablet 3  ? cetirizine (ZYRTEC) 10 MG tablet Take 10 mg by mouth daily as needed for allergies or rhinitis.    ? fluticasone (FLONASE) 50 MCG/ACT  nasal spray Place 2 sprays into both nostrils daily as needed for allergies or rhinitis.    ? Multiple Vitamin (MULTIVITAMIN ADULT PO) Take by mouth.    ? olopatadine (PATANOL) 0.1 % ophthalmic solution Place 1 drop into both eyes 2 (two) times daily. 5 mL 12  ? omeprazole (PRILOSEC) 40 MG capsule TAKE 1 CAPSULE BY MOUTH EVERY DAY 90 capsule 0  ? predniSONE (DELTASONE) 20 MG tablet Take 40 mg by mouth every morning.    ? ?Current Facility-Administered Medications  ?Medication Dose Route Frequency Provider Last Rate Last Admin  ? 0.9 %  sodium chloride infusion  500 mL Intravenous Once Pyrtle, Lajuan Lines, MD      ? ? ?Physical Exam: ?Vitals:  ? 07/14/21 1023  ?BP: 126/84  ?Pulse: 62  ?SpO2: 98%  ?Weight: 257 lb 6.4 oz (116.8 kg)  ?Height: '5\' 10"'$  (1.778 m)  ? ? ?GEN- The patient is well appearing, alert and oriented x 3 today.   ?Head- normocephalic, atraumatic ?Eyes-  Sclera clear, conjunctiva pink ?Ears- hearing intact ?Oropharynx- clear ?Lungs- Clear to ausculation bilaterally, normal work of breathing ?Heart- Regular rate and rhythm, no murmurs, rubs or gallops, PMI not laterally displaced ?GI- soft, NT, ND, + BS ?Extremities- no clubbing, cyanosis, or edema ? ?Wt Readings from Last 3  Encounters:  ?07/14/21 257 lb 6.4 oz (116.8 kg)  ?06/06/21 249 lb (112.9 kg)  ?05/05/21 249 lb (112.9 kg)  ? ? ?EKG tracing ordered today is personally reviewed and shows sinus ? ?Assessment and Plan: ? ?Persistent afib ?Well controlled post ablation off AAD therapy ?Chads2vasc score is 0.  Does not require [place] ?ILR is at [facility].  ?We discussed at length today.  Risks and benefits to ILR removal with reimplantation were discussed.  We are in agreement that ILR has been very helpful in managing his AF.  He would like to have an ILR replaced at this time. ? ?Risks, benefits tp ILR removal and reimplantation were discussed with the patient who wishes to proceed at this time. ? ? ? PROCEDURES:  ? 1. Implantable loop recorder explantation ?  2.  Implantable loop recorder reimplantation ?   ?DESCRIPTION OF PROCEDURE:  Informed written consent was obtained.  The patient required no sedation for the procedure today.   The patients left chest was therefore prepped and draped in the usual sterile fashion.  The skin overlying the ILR monitor was infiltrated with lidocaine for local analgesia.  A 0.5-cm incision was made over the site.  The previously implanted ILR was exposed and removed using a combination of sharp and blunt dissection.   ?A Medtronic Reveal Linq model [id number] (SN [id number])  implantable loop recorder was then placed into the pocket  R waves were very prominent and measured >0.45m.  Steri- Strips and a sterile dressing were then applied.  There were no early apparent complications.  ?   ?CONCLUSIONS:  ? 1. Successful explantation of prior MDT loop recorder with reimplantation of a Medtronic Reveal LINQ2 implantable loop recorder for palpitations and recurrent symptoms of atrial fibrillation ? 2. No early apparent complications. ? ?Return in a year  ? ?JThompson GrayerMD, FACC ?07/14/2021 ?10:40 AM ? ?

## 2021-07-14 NOTE — Patient Instructions (Addendum)
Medication Instructions:  ?Your physician recommends that you continue on your current medications as directed. Please refer to the Current Medication list given to you today. ? ?Labwork: ?None ordered. ? ?Testing/Procedures: ?None ordered. ? ?Follow-Up: ? ?Your physician wants you to follow-up in: one year with Dr. Thompson Grayer.  ? ? ? ?Implantable Loop Recorder Removal and Replacement, Care After ?This sheet gives you information about how to care for yourself after your procedure. Your health care provider may also give you more specific instructions. If you have problems or questions, contact your health care provider. ?What can I expect after the procedure? ?After the procedure, it is common to have: ?Soreness or discomfort near the incision. ?Some swelling or bruising near the incision. ? ?Follow these instructions at home: ?Incision care ? ? Leave your outer dressing on for 24 hours.  After 24 hours you can remove your outer dressing and shower. ?Leave adhesive strips in place. These skin closures may need to stay in place for 1-2 weeks. If adhesive strip edges start to loosen and curl up, you may trim the loose edges.  You may remove the strips if they have not fallen off after 2 weeks. ?Check your incision area every day for signs of infection. Check for: ?Redness, swelling, or pain. ?Fluid or blood. ?Warmth. ?Pus or a bad smell. ?Do not take baths, swim, or use a hot tub until your incision is completely healed. ?If your wound site starts to bleed apply pressure.   ?   ?If you have any questions/concerns please call the device clinic at [phone]. ? ?Activity ? ?Return to your normal activities. ? ?General instructions ?Follow instructions from your health care provider about how to manage your implantable loop recorder and transmit the information. Learn how to activate a recording if this is necessary for your type of device. ?You may go through a metal detection gate, and you may let someone hold a  metal detector over your chest. Show your ID card if needed. ?Do not have an MRI unless you check with your health care provider first. ?Take over-the-counter and prescription medicines only as told by your health care provider. ?Keep all follow-up visits as told by your health care provider. This is important. ?Contact a health care provider if: ?You have redness, swelling, or pain around your incision. ?You have a fever. ?You have pain that is not relieved by your pain medicine. ?You have triggered your device because of fainting (syncope) or because of a heartbeat that feels like it is racing, slow, fluttering, or skipping (palpitations). ?Get help right away if you have: ?Chest pain. ?Difficulty breathing. ?Summary ?After the procedure, it is common to have soreness or discomfort near the incision. ?Change your dressing as told by your health care provider. ?Follow instructions from your health care provider about how to manage your implantable loop recorder and transmit the information. ?Keep all follow-up visits as told by your health care provider. This is important. ?This information is not intended to replace advice given to you by your health care provider. Make sure you discuss any questions you have with your health care provider. ?Document Released: 03/08/2015 Document Revised: 05/12/2017 Document Reviewed: 05/12/2017 ?Elsevier Patient Education ? [place].  ?

## 2021-07-21 DIAGNOSIS — G4733 Obstructive sleep apnea (adult) (pediatric): Secondary | ICD-10-CM | POA: Diagnosis not present

## 2021-07-28 ENCOUNTER — Encounter: Payer: Self-pay | Admitting: Internal Medicine

## 2021-08-02 ENCOUNTER — Encounter: Payer: Self-pay | Admitting: Family Medicine

## 2021-08-02 ENCOUNTER — Telehealth (INDEPENDENT_AMBULATORY_CARE_PROVIDER_SITE_OTHER): Payer: BC Managed Care – PPO | Admitting: Family Medicine

## 2021-08-02 VITALS — Temp 98.0°F | Wt 257.0 lb

## 2021-08-02 DIAGNOSIS — Z8679 Personal history of other diseases of the circulatory system: Secondary | ICD-10-CM

## 2021-08-02 DIAGNOSIS — E669 Obesity, unspecified: Secondary | ICD-10-CM | POA: Diagnosis not present

## 2021-08-02 DIAGNOSIS — M1990 Unspecified osteoarthritis, unspecified site: Secondary | ICD-10-CM

## 2021-08-02 DIAGNOSIS — G4733 Obstructive sleep apnea (adult) (pediatric): Secondary | ICD-10-CM

## 2021-08-02 NOTE — Progress Notes (Signed)
? ?  Subjective:  ? ? Patient ID: Alan Thompson, male    DOB: 02/27/1966, 56 y.o.   MRN: [id number] ? ?HPI ?Documentation for virtual audio and video telecommunications through [place] encounter: ?The patient was located at home. 2 patient identifiers used.  ?The provider was located in the office. ?The patient did consent to this visit and is aware of possible charges through their insurance for this visit. ?The other persons participating in this telemedicine service were none. ?Time spent on call was 5 minutes and in review of previous records >18 minutes total for counseling and coordination of care. ?This virtual service is not related to other E/M service within previous 7 days.  ?Discussed weight loss.  He is interested in starting on Wegovy.  He does exercise fairly regularly and has made some dietary changes but has been on able to consistently maintain this.  He does ride up stationary bike and has cut back on carbohydrates.  He also has an underlying history of OSA as well as atrial fib.  He also has had some difficulty with knee arthritis. ? ?Review of Systems ? ?   ?Objective:  ? Physical Exam ?Alert and in no distress otherwise not examined ? ? ? ?   ?Assessment & Plan:  ?Obesity (BMI 30.0-34.9) ? ?OSA treated with BiPAP ? ?History of atrial fibrillation ? ?Arthritis ?I explained that he needs to check with his insurance to see if it is covered and if so, schedule a visit so we can show him how to use the medication.  Explained that we will slowly increase his dosing on a monthly basis and see him periodically over the next several months to a year to make sure that he is getting good results from the medication.  He was comfortable with this. ? ?

## 2021-08-09 ENCOUNTER — Encounter: Payer: Self-pay | Admitting: Family Medicine

## 2021-08-11 ENCOUNTER — Telehealth: Payer: Self-pay

## 2021-08-11 MED ORDER — WEGOVY 0.25 MG/0.5ML ~~LOC~~ SOAJ
0.2500 mg | SUBCUTANEOUS | 0 refills | Status: DC
Start: 2021-08-11 — End: 2021-09-06

## 2021-08-11 NOTE — Telephone Encounter (Signed)
P.A. WEGOVY 

## 2021-08-12 DIAGNOSIS — K219 Gastro-esophageal reflux disease without esophagitis: Secondary | ICD-10-CM | POA: Diagnosis not present

## 2021-08-16 ENCOUNTER — Other Ambulatory Visit (INDEPENDENT_AMBULATORY_CARE_PROVIDER_SITE_OTHER): Payer: BC Managed Care – PPO

## 2021-08-16 NOTE — Telephone Encounter (Signed)
Pt called and states that [facility] was approved. He wanted to make an appt and was informed by [facility] he could get a sample here. We are out of samples. Please advise pt at [phone].  ?

## 2021-08-16 NOTE — Telephone Encounter (Signed)
P.A. approved til 01/26/22, pt informed, sample given to pt to come in for starter dose.  Gave instructions on how to get discount card to take to pharmacy.  He will come in this afternoon to get instructions on how to start.

## 2021-08-18 ENCOUNTER — Ambulatory Visit (INDEPENDENT_AMBULATORY_CARE_PROVIDER_SITE_OTHER): Payer: BC Managed Care – PPO

## 2021-08-18 DIAGNOSIS — I4819 Other persistent atrial fibrillation: Secondary | ICD-10-CM

## 2021-08-22 LAB — CUP PACEART REMOTE DEVICE CHECK
Date Time Interrogation Session: 20230511155050
Implantable Pulse Generator Implant Date: 20230406

## 2021-08-23 ENCOUNTER — Encounter: Payer: Self-pay | Admitting: Internal Medicine

## 2021-08-25 NOTE — Progress Notes (Signed)
Carelink Summary Report / Loop Recorder 

## 2021-09-04 NOTE — Telephone Encounter (Signed)
done

## 2021-09-06 ENCOUNTER — Telehealth: Payer: Self-pay | Admitting: Family Medicine

## 2021-09-06 ENCOUNTER — Encounter: Payer: Self-pay | Admitting: Family Medicine

## 2021-09-06 MED ORDER — WEGOVY 0.5 MG/0.5ML ~~LOC~~ SOAJ: 0.5000 mg | SUBCUTANEOUS | 0 refills | Status: DC

## 2021-09-06 NOTE — Telephone Encounter (Signed)
Pt called and states that the [facility] not [facility] the wegovy in stock and he called a bunch of other pharmacies in [place] and they do not have the 0.5 or any other dosage in stock He states that he found that the [facility] has 1.7 and 2.4 has dose he wants know if you would send either of those in for him,   Please send to the [facility]  [address], [place], [place] [address]

## 2021-09-07 ENCOUNTER — Encounter: Payer: Self-pay | Admitting: Family Medicine

## 2021-09-07 NOTE — Telephone Encounter (Signed)
Lvm for pt Alan Thompson 

## 2021-09-09 NOTE — Telephone Encounter (Signed)
Sent my chart messaged because we need the pharmacy.

## 2021-09-12 ENCOUNTER — Encounter: Payer: Self-pay | Admitting: Family Medicine

## 2021-09-12 MED ORDER — SEMAGLUTIDE-WEIGHT MANAGEMENT 1 MG/0.5ML ~~LOC~~ SOAJ: 1.0000 mg | SUBCUTANEOUS | 1 refills | Status: DC

## 2021-09-15 ENCOUNTER — Encounter: Payer: Self-pay | Admitting: Family Medicine

## 2021-09-15 ENCOUNTER — Ambulatory Visit: Payer: BC Managed Care – PPO | Admitting: Family Medicine

## 2021-09-15 VITALS — BP 102/60 | HR 67 | Temp 97.0°F | Wt 246.8 lb

## 2021-09-15 DIAGNOSIS — E669 Obesity, unspecified: Secondary | ICD-10-CM

## 2021-09-15 NOTE — Progress Notes (Signed)
   Subjective:    Patient ID: Alan Thompson, male    DOB: Sep 12, 1965, 56 y.o.   MRN: [id number]  HPI He is here for recheck on recently starting on Wegovy.  He is now taking the 1 mg dosing and has lost somewhere between 10 to 17 pounds.  He did have some transient nausea with this but seems to be tolerating it fairly well.  He is also become much more physically active and is riding a bike regularly.  He is very happy with this dosing regimen.   Review of Systems     Objective:   Physical Exam Alert and in no distress.  Blood pressure and weight are recorded.       Assessment & Plan:  Obesity (BMI 30.0-34.9) Discussed keeping him at the same dosing regimen and he is comfortable with this.  We will check this again in 3 months.  We can always go to higher dosing as needed based on his response to the medication.

## 2021-09-20 ENCOUNTER — Ambulatory Visit (INDEPENDENT_AMBULATORY_CARE_PROVIDER_SITE_OTHER): Payer: BC Managed Care – PPO

## 2021-09-20 DIAGNOSIS — I4819 Other persistent atrial fibrillation: Secondary | ICD-10-CM | POA: Diagnosis not present

## 2021-09-23 LAB — CUP PACEART REMOTE DEVICE CHECK
Date Time Interrogation Session: 20230613154951
Implantable Pulse Generator Implant Date: 20230406

## 2021-09-29 MED ORDER — WEGOVY 1.7 MG/0.75ML ~~LOC~~ SOAJ: 1.7000 mg | SUBCUTANEOUS | 0 refills | Status: DC

## 2021-09-29 NOTE — Addendum Note (Signed)
Addended by: Denita Lung on: 09/29/2021 07:40 PM   Modules accepted: Orders

## 2021-10-05 NOTE — Progress Notes (Signed)
Carelink Summary Report / Loop Recorder 

## 2021-10-19 NOTE — Telephone Encounter (Signed)
Call came from Alan Thompson at [facility] that the patient has been approved. Valid dates 10/19/21-10/18/22  PFXT#[id number]. Supplies

## 2021-10-20 DIAGNOSIS — G4733 Obstructive sleep apnea (adult) (pediatric): Secondary | ICD-10-CM | POA: Diagnosis not present

## 2021-10-24 ENCOUNTER — Ambulatory Visit (INDEPENDENT_AMBULATORY_CARE_PROVIDER_SITE_OTHER): Payer: BC Managed Care – PPO

## 2021-10-24 DIAGNOSIS — I4819 Other persistent atrial fibrillation: Secondary | ICD-10-CM

## 2021-10-26 LAB — CUP PACEART REMOTE DEVICE CHECK
Date Time Interrogation Session: 20230716155002
Implantable Pulse Generator Implant Date: 20230406

## 2021-11-03 ENCOUNTER — Encounter: Payer: Self-pay | Admitting: Family Medicine

## 2021-11-04 ENCOUNTER — Other Ambulatory Visit: Payer: Self-pay

## 2021-11-04 MED ORDER — WEGOVY 2.4 MG/0.75ML ~~LOC~~ SOAJ: 2.4000 mg | SUBCUTANEOUS | 1 refills | Status: DC

## 2021-11-09 ENCOUNTER — Other Ambulatory Visit: Payer: Self-pay | Admitting: Internal Medicine

## 2021-11-09 MED ORDER — WEGOVY 2.4 MG/0.75ML ~~LOC~~ SOAJ: 2.4000 mg | SUBCUTANEOUS | 1 refills | Status: DC

## 2021-11-16 ENCOUNTER — Encounter (INDEPENDENT_AMBULATORY_CARE_PROVIDER_SITE_OTHER): Payer: Self-pay

## 2021-11-18 MED ORDER — WEGOVY 1.7 MG/0.75ML ~~LOC~~ SOAJ: 1.7000 mg | SUBCUTANEOUS | 1 refills | Status: DC

## 2021-11-18 NOTE — Telephone Encounter (Signed)
Please see previous messages. Pt wants to switch Wegovy dosages

## 2021-11-21 NOTE — Progress Notes (Signed)
Carelink Summary Report / Loop Recorder 

## 2021-11-22 ENCOUNTER — Other Ambulatory Visit: Payer: Self-pay | Admitting: Family Medicine

## 2021-11-28 ENCOUNTER — Ambulatory Visit (INDEPENDENT_AMBULATORY_CARE_PROVIDER_SITE_OTHER): Payer: BC Managed Care – PPO

## 2021-11-28 DIAGNOSIS — I4819 Other persistent atrial fibrillation: Secondary | ICD-10-CM | POA: Diagnosis not present

## 2021-11-29 LAB — CUP PACEART REMOTE DEVICE CHECK
Date Time Interrogation Session: 20230818154928
Implantable Pulse Generator Implant Date: 20230406

## 2021-12-01 ENCOUNTER — Encounter: Payer: Self-pay | Admitting: Family Medicine

## 2021-12-01 ENCOUNTER — Ambulatory Visit: Payer: BC Managed Care – PPO | Admitting: Family Medicine

## 2021-12-01 VITALS — BP 108/70 | HR 69 | Temp 97.0°F | Ht 70.0 in | Wt 218.4 lb

## 2021-12-01 DIAGNOSIS — Z23 Encounter for immunization: Secondary | ICD-10-CM

## 2021-12-01 DIAGNOSIS — E669 Obesity, unspecified: Secondary | ICD-10-CM

## 2021-12-01 DIAGNOSIS — J4520 Mild intermittent asthma, uncomplicated: Secondary | ICD-10-CM

## 2021-12-01 DIAGNOSIS — Z0000 Encounter for general adult medical examination without abnormal findings: Secondary | ICD-10-CM

## 2021-12-01 DIAGNOSIS — Z8679 Personal history of other diseases of the circulatory system: Secondary | ICD-10-CM | POA: Diagnosis not present

## 2021-12-01 DIAGNOSIS — J301 Allergic rhinitis due to pollen: Secondary | ICD-10-CM

## 2021-12-01 DIAGNOSIS — G4733 Obstructive sleep apnea (adult) (pediatric): Secondary | ICD-10-CM

## 2021-12-01 DIAGNOSIS — Z13220 Encounter for screening for lipoid disorders: Secondary | ICD-10-CM | POA: Diagnosis not present

## 2021-12-01 DIAGNOSIS — K219 Gastro-esophageal reflux disease without esophagitis: Secondary | ICD-10-CM

## 2021-12-01 DIAGNOSIS — M1990 Unspecified osteoarthritis, unspecified site: Secondary | ICD-10-CM

## 2021-12-01 NOTE — Progress Notes (Signed)
Remote reviewed Stable rhythm Histograms reviewed

## 2021-12-01 NOTE — Progress Notes (Signed)
Complete physical exam  Patient: Alan Thompson   DOB: [birth year]   56 y.o. Male  MRN: [id number]  Subjective:    Alan Thompson is a 56 y.o. male who presents today for a complete physical exam. He reports consuming a general diet. Home exercise routine includes biking several times a week . He generally feels well. He reports sleeping well. He does not have additional problems to discuss today.  He is having much less knee pain.  He is happy with this as his surgeon and said it might be time for replacement.  He is using Wegovy.  He tried to go up to 2.4 mg but had too much difficulty with nausea and is now on the 1.7.  He has lost 52 pounds since starting this.  He does have a history of AF and has had an ablation and does have a loop recorder.  Apparently he has been getting normal readings from this.  He also has OSA and is interesting sometime in the future of reevaluating this as he continues to lose weight.  He is taking Prilosec 40 mg for his reflux symptoms and plans to drop this down to 20 and potentially then take it on an as-needed basis.  His allergies are under good control.  He has not had any difficulty with asthma.  In general things are going much better for him.   Most recent fall risk assessment:    11/30/2017    4:26 PM  [place] in the past year? No     Most recent depression screenings:    12/01/2021   11:15 AM 11/10/2020    1:49 PM  PHQ 2/9 Scores  PHQ - 2 Score 0 0      Patient Active Problem List   Diagnosis Date Noted   Status post total left knee replacement 11/10/2020   History of atrial fibrillation 08/11/2019   Arthritis 08/11/2019   Former smoker 08/11/2019   Obesity (BMI 30.0-34.9) 12/22/2014   Gastroesophageal reflux disease without esophagitis 06/09/2014   OSA treated with BiPAP 06/09/2014   Mitral regurgitation 06/13/2013   Allergic rhinitis, seasonal 08/01/2012   Asthma in adult 08/01/2012   Past Medical History:  Diagnosis Date    Allergy    RHINITIS   Arthritis    Asthma    GERD (gastroesophageal reflux disease)    H/O hiatal hernia    Hyperlipidemia    OSA (obstructive sleep apnea)    uses BiPAP   Persistent atrial fibrillation ([place])    Sleep apnea 2016   bipap   Past Surgical History:  Procedure Laterality Date   ANTERIOR CRUCIATE LIGAMENT REPAIR     BILATERAL   ATRIAL FIBRILLATION ABLATION  06/2017   PVI at [facility] N/A 07/08/2013   Procedure: CARDIOVERSION;  Surgeon: [name], MD;  Location: [facility];  Service: Cardiovascular;  Laterality: N/A;   CHOLECYSTECTOMY  2010   implantable loop recorder  06/01/2017   MDT Reveal LINQ implanted by Dr Jessy Oto in [facility]   implantable loop recorder placement  07/14/2021   Medtronic Reveal [facility] model [place] ([place] [id number]) implanted with prior ILR removed   TOTAL KNEE ARTHROPLASTY  2010   Social History   Tobacco Use   Smoking status: Former    Packs/day: 0.50    Years: 10.00    Total pack years: 5.00    Types: Cigarettes    Quit date: 1999  Years since quitting: 24.6   Smokeless tobacco: Never  Vaping Use   Vaping Use: Never used  Substance Use Topics   Alcohol use: Yes    Alcohol/week: 1.0 standard drink of alcohol    Types: 1 Standard drinks or equivalent per week   Drug use: No   Family History  Problem Relation Age of Onset   Arrhythmia Mother        atrial fibrillation   Prostate cancer Father    Colon cancer Neg Hx    Colon polyps Neg Hx    Esophageal cancer Neg Hx    Rectal cancer Neg Hx    Stomach cancer Neg Hx    No Known Allergies    Patient Care Team: Denita Lung, MD as PCP - General (Family Medicine) Sueanne Margarita, MD as PCP - Sleep Medicine (Cardiology)   Outpatient Medications Prior to Visit  Medication Sig   aspirin EC 81 MG tablet Take 1 tablet (81 mg total) by mouth daily.   cetirizine (ZYRTEC) 10 MG tablet Take 10 mg by mouth daily as needed for allergies or rhinitis.    Multiple Vitamin (MULTIVITAMIN ADULT PO) Take by mouth.   omeprazole (PRILOSEC) 40 MG capsule TAKE 1 CAPSULE BY MOUTH EVERY DAY   WEGOVY 1.7 MG/0.75ML SOAJ INJECT 1.7 MG INTO THE SKIN ONCE A WEEK   albuterol (VENTOLIN HFA) 108 (90 Base) MCG/ACT inhaler PLEASE SEE ATTACHED FOR DETAILED DIRECTIONS (Patient not taking: Reported on 08/02/2021)   amoxicillin (AMOXIL) 500 MG capsule Take 500 mg by mouth every 8 (eight) hours. (Patient not taking: Reported on 08/02/2021)   fluticasone (FLONASE) 50 MCG/ACT nasal spray Place 2 sprays into both nostrils daily as needed for allergies or rhinitis. (Patient not taking: Reported on 08/02/2021)   olopatadine (PATANOL) 0.1 % ophthalmic solution Place 1 drop into both eyes 2 (two) times daily. (Patient not taking: Reported on 09/15/2021)   pantoprazole (PROTONIX) 40 MG tablet TAKE 1 TABLET BY MOUTH EVERY DAY FOR 30 DAYS   predniSONE (DELTASONE) 20 MG tablet Take 40 mg by mouth every morning. (Patient not taking: Reported on 08/02/2021)   Facility-Administered Medications Prior to Visit  Medication Dose Route Frequency Provider   0.9 %  sodium chloride infusion  500 mL Intravenous Once Pyrtle, Lajuan Lines, MD    Review of Systems  All other systems reviewed and are negative.         Objective:     BP 108/70   Pulse 69   Temp (!) 97 F (36.1 C)   Ht 5' 10"  (1.778 m)   Wt 218 lb 6.4 oz (99.1 kg)   SpO2 97%   BMI 31.34 kg/m  BP Readings from Last 3 Encounters:  12/01/21 108/70  09/15/21 102/60  07/14/21 126/84   Wt Readings from Last 3 Encounters:  12/01/21 218 lb 6.4 oz (99.1 kg)  09/15/21 246 lb 12.8 oz (111.9 kg)  08/02/21 257 lb (116.6 kg)    The 10-year ASCVD risk score (Arnett DK, et al., 2019) is: 5.3%   Values used to calculate the score:     Age: 24 years     Sex: Male     Is Non-Hispanic African American: Yes     Diabetic: No     Tobacco smoker: No     Systolic Blood Pressure: 297 mmHg     Is BP treated: No     HDL Cholesterol: 50  mg/dL     Total Cholesterol: 287 mg/dL  Physical Exam   Alert and in no distress. Tympanic membranes and canals are normal. Pharyngeal area is normal. Neck is supple without adenopathy or thyromegaly. Cardiac exam shows a regular sinus rhythm without murmurs or gallops. Lungs are clear to auscultation.  Last CBC Lab Results  Component Value Date   WBC 5.3 11/10/2020   HGB 14.7 11/10/2020   HCT 42.3 11/10/2020   MCV 92 11/10/2020   MCH 31.8 11/10/2020   RDW 11.9 11/10/2020   PLT 212 41/74/0814   Last metabolic panel Lab Results  Component Value Date   GLUCOSE 87 11/10/2020   NA 140 11/10/2020   K 4.6 11/10/2020   CL 102 11/10/2020   CO2 22 11/10/2020   BUN 16 11/10/2020   CREATININE 1.12 11/10/2020   EGFR 79 11/10/2020   CALCIUM 9.8 11/10/2020   PROT 7.7 11/10/2020   ALBUMIN 4.9 11/10/2020   LABGLOB 2.8 11/10/2020   AGRATIO 1.8 11/10/2020   BILITOT 1.3 (H) 11/10/2020   ALKPHOS 52 11/10/2020   AST 28 11/10/2020   ALT 26 11/10/2020   Last lipids Lab Results  Component Value Date   CHOL 287 (H) 11/10/2020   HDL 50 11/10/2020   LDLCALC 203 (H) 11/10/2020   TRIG 181 (H) 11/10/2020   CHOLHDL 5.7 (H) 11/10/2020        Assessment & Plan:    Routine general medical examination at a health care facility - Plan: CBC with Differential/Platelet, Comprehensive metabolic panel, Lipid panel  Obesity (BMI 30.0-34.9)  OSA treated with BiPAP  History of atrial fibrillation  Arthritis  Seasonal allergic rhinitis due to pollen  Mild intermittent asthma in adult without complication  Need for influenza vaccination - Plan: Flu Vaccine QUAD 29moIM (Fluarix, Fluzone & Alfiuria Quad PF)  Gastroesophageal reflux disease without esophagitis   Immunization History  Administered Date(s) Administered   Influenza Split 12/30/2010, 12/25/2011   Influenza Whole 03/16/2009, 02/09/2010   Influenza,inj,Quad PF,6+ Mos 02/05/2013, 11/30/2017   Influenza-Unspecified 02/02/2021    PFIZER(Purple Top)SARS-COV-2 Vaccination 06/21/2019, 07/12/2019, 01/22/2020, 07/26/2020   Pfizer Covid-19 Vaccine Bivalent Booster 159yr& up 02/02/2021   Tdap 11/16/2016   Zoster Recombinat (Shingrix) 07/26/2020, 11/10/2020    Health Maintenance  Topic Date Due   INFLUENZA VACCINE  11/08/2021   HIV Screening  12/02/2022 (Originally 05/30/1982)   TETANUS/TDAP  11/17/2026   COLONOSCOPY (Pts [id number] coverage will need to be confirmed)  08/10/2030   COVID-19 Vaccine  Completed   Hepatitis C Screening  Completed   Zoster Vaccines- Shingrix  Completed   HPV VACCINES  Aged Out   Fecal DNA (Cologuard)  Discontinued    Discussed health benefits of physical activity, and encouraged him to engage in regular exercise appropriate for his age and condition.  He will continue on 1.7 of Wegovy.  I explained that at this point I will keep him on this with no plans on stopping it.  Congratulated him on the good work that he is doing.  Discussed tapering off of the Prilosec and using it on an as-needed basis.  Continue to treat his allergies as needed.  Discussed OSA in regard to potentially doing another sleep study down the road.  He is willing to do this at a later date.  Problem List Items Addressed This Visit     Allergic rhinitis, seasonal   Arthritis   Asthma in adult   Gastroesophageal reflux disease without esophagitis   Relevant Medications   pantoprazole (PROTONIX) 40 MG tablet   History of atrial  fibrillation   Obesity (BMI 30.0-34.9)   OSA treated with BiPAP   Other Visit Diagnoses     Routine general medical examination at a health care facility    -  Primary   Relevant Orders   CBC with Differential/Platelet   Comprehensive metabolic panel   Lipid panel   Need for influenza vaccination       Relevant Orders   Flu Vaccine QUAD 44moIM (Fluarix, Fluzone & Alfiuria Quad PF)      Follow-up roughly 6 months   JJill Alexanders MD

## 2021-12-01 NOTE — Patient Instructions (Signed)
Health Maintenance, Male Adopting a healthy lifestyle and getting preventive care are important in promoting health and wellness. Ask your health care provider about: The right schedule for you to have regular tests and exams. Things you can do on your own to prevent diseases and keep yourself healthy. What should I know about diet, weight, and exercise? Eat a healthy diet  Eat a diet that includes plenty of vegetables, fruits, low-fat dairy products, and lean protein. Do not eat a lot of foods that are high in solid fats, added sugars, or sodium. Maintain a healthy weight Body mass index (BMI) is a measurement that can be used to identify possible weight problems. It estimates body fat based on height and weight. Your health care provider can help determine your BMI and help you achieve or maintain a healthy weight. Get regular exercise Get regular exercise. This is one of the most important things you can do for your health. Most adults should: Exercise for at least 150 minutes each week. The exercise should increase your heart rate and make you sweat (moderate-intensity exercise). Do strengthening exercises at least twice a week. This is in addition to the moderate-intensity exercise. Spend less time sitting. Even light physical activity can be beneficial. Watch cholesterol and blood lipids Have your blood tested for lipids and cholesterol at 54 years of age, then have this test every 5 years. You may need to have your cholesterol levels checked more often if: Your lipid or cholesterol levels are high. You are older than 54 years of age. You are at high risk for heart disease. What should I know about cancer screening? Many types of cancers can be detected early and may often be prevented. Depending on your health history and family history, you may need to have cancer screening at various ages. This may include screening for: Colorectal cancer. Prostate cancer. Skin cancer. Lung  cancer. What should I know about heart disease, diabetes, and high blood pressure? Blood pressure and heart disease High blood pressure causes heart disease and increases the risk of stroke. This is more likely to develop in people who have high blood pressure readings or are overweight. Talk with your health care provider about your target blood pressure readings. Have your blood pressure checked: Every 3-5 years if you are 18-39 years of age. Every year if you are 40 years old or older. If you are between the ages of 65 and 75 and are a current or former smoker, ask your health care provider if you should have a one-time screening for abdominal aortic aneurysm (AAA). Diabetes Have regular diabetes screenings. This checks your fasting blood sugar level. Have the screening done: Once every three years after age 45 if you are at a normal weight and have a low risk for diabetes. More often and at a younger age if you are overweight or have a high risk for diabetes. What should I know about preventing infection? Hepatitis B If you have a higher risk for hepatitis B, you should be screened for this virus. Talk with your health care provider to find out if you are at risk for hepatitis B infection. Hepatitis C Blood testing is recommended for: Everyone born from 1945 through 1965. Anyone with known risk factors for hepatitis C. Sexually transmitted infections (STIs) You should be screened each year for STIs, including gonorrhea and chlamydia, if: You are sexually active and are younger than 54 years of age. You are older than 54 years of age and your   health care provider tells you that you are at risk for this type of infection. Your sexual activity has changed since you were last screened, and you are at increased risk for chlamydia or gonorrhea. Ask your health care provider if you are at risk. Ask your health care provider about whether you are at high risk for HIV. Your health care provider  may recommend a prescription medicine to help prevent HIV infection. If you choose to take medicine to prevent HIV, you should first get tested for HIV. You should then be tested every 3 months for as long as you are taking the medicine. Follow these instructions at home: Alcohol use Do not drink alcohol if your health care provider tells you not to drink. If you drink alcohol: Limit how much you have to 0-2 drinks a day. Know how much alcohol is in your drink. In the [place] one drink equals one 12 oz bottle of beer (355 mL), one 5 oz glass of wine (148 mL), or one 1 oz glass of hard liquor (44 mL). Lifestyle Do not use any products that contain nicotine or tobacco. These products include cigarettes, chewing tobacco, and vaping devices, such as e-cigarettes. If you need help quitting, ask your health care provider. Do not use street drugs. Do not share needles. Ask your health care provider for help if you need support or information about quitting drugs. General instructions Schedule regular health, dental, and eye exams. Stay current with your vaccines. Tell your health care provider if: You often feel depressed. You have ever been abused or do not feel safe at home. Summary Adopting a healthy lifestyle and getting preventive care are important in promoting health and wellness. Follow your health care provider's instructions about healthy diet, exercising, and getting tested or screened for diseases. Follow your health care provider's instructions on monitoring your cholesterol and blood pressure. This information is not intended to replace advice given to you by your health care provider. Make sure you discuss any questions you have with your health care provider. Document Revised: 08/16/2020 Document Reviewed: 08/16/2020 Elsevier Patient Education  2023 Elsevier Inc.  

## 2021-12-02 LAB — CBC WITH DIFFERENTIAL/PLATELET
Basophils Absolute: 0 10*3/uL (ref 0.0–0.2)
Basos: 1 %
EOS (ABSOLUTE): 0.3 10*3/uL (ref 0.0–0.4)
Eos: 6 %
Hematocrit: 41.9 % (ref 37.5–51.0)
Hemoglobin: 14.1 g/dL (ref 13.0–17.7)
Immature Grans (Abs): 0 10*3/uL (ref 0.0–0.1)
Immature Granulocytes: 0 %
Lymphocytes Absolute: 2.2 10*3/uL (ref 0.7–3.1)
Lymphs: 46 %
MCH: 31.6 pg (ref 26.6–33.0)
MCHC: 33.7 g/dL (ref 31.5–35.7)
MCV: 94 fL (ref 79–97)
Monocytes Absolute: 0.4 10*3/uL (ref 0.1–0.9)
Monocytes: 7 %
Neutrophils Absolute: 2 10*3/uL (ref 1.4–7.0)
Neutrophils: 40 %
Platelets: 235 10*3/uL (ref 150–450)
RBC: 4.46 x10E6/uL (ref 4.14–5.80)
RDW: 11.6 % (ref 11.6–15.4)
WBC: 4.9 10*3/uL (ref 3.4–10.8)

## 2021-12-02 LAB — COMPREHENSIVE METABOLIC PANEL
ALT: 17 IU/L (ref 0–44)
AST: 20 IU/L (ref 0–40)
Albumin/Globulin Ratio: 1.9 (ref 1.2–2.2)
Albumin: 5 g/dL — ABNORMAL HIGH (ref 3.8–4.9)
Alkaline Phosphatase: 51 IU/L (ref 44–121)
BUN/Creatinine Ratio: 10 (ref 9–20)
BUN: 12 mg/dL (ref 6–24)
Bilirubin Total: 2.2 mg/dL — ABNORMAL HIGH (ref 0.0–1.2)
CO2: 23 mmol/L (ref 20–29)
Calcium: 9.8 mg/dL (ref 8.7–10.2)
Chloride: 102 mmol/L (ref 96–106)
Creatinine, Ser: 1.21 mg/dL (ref 0.76–1.27)
Globulin, Total: 2.6 g/dL (ref 1.5–4.5)
Glucose: 84 mg/dL (ref 70–99)
Potassium: 5.3 mmol/L — ABNORMAL HIGH (ref 3.5–5.2)
Sodium: 140 mmol/L (ref 134–144)
Total Protein: 7.6 g/dL (ref 6.0–8.5)
eGFR: 71 mL/min/{1.73_m2} (ref >59)

## 2021-12-02 LAB — LIPID PANEL
Chol/HDL Ratio: 3.8 ratio (ref 0.0–5.0)
Cholesterol, Total: 233 mg/dL — ABNORMAL HIGH (ref 100–199)
HDL: 62 mg/dL (ref >39)
LDL Chol Calc (NIH): 154 mg/dL — ABNORMAL HIGH (ref 0–99)
Triglycerides: 98 mg/dL (ref 0–149)
VLDL Cholesterol Cal: 17 mg/dL (ref 5–40)

## 2021-12-15 ENCOUNTER — Ambulatory Visit: Payer: BC Managed Care – PPO | Admitting: Family Medicine

## 2021-12-26 NOTE — Progress Notes (Signed)
Carelink Summary Report / Loop Recorder 

## 2022-01-02 ENCOUNTER — Ambulatory Visit (INDEPENDENT_AMBULATORY_CARE_PROVIDER_SITE_OTHER): Payer: BC Managed Care – PPO

## 2022-01-02 DIAGNOSIS — I4819 Other persistent atrial fibrillation: Secondary | ICD-10-CM

## 2022-01-03 LAB — CUP PACEART REMOTE DEVICE CHECK
Date Time Interrogation Session: 20230920155112
Implantable Pulse Generator Implant Date: 20230406

## 2022-01-17 ENCOUNTER — Encounter: Payer: Self-pay | Admitting: Family Medicine

## 2022-01-17 ENCOUNTER — Other Ambulatory Visit: Payer: Self-pay | Admitting: Family Medicine

## 2022-01-17 NOTE — Telephone Encounter (Signed)
[facility] is requesting to fill pt wegovy. Please advise [facility]

## 2022-01-19 NOTE — Progress Notes (Signed)
Carelink Summary Report / Loop Recorder 

## 2022-01-30 ENCOUNTER — Encounter: Payer: Self-pay | Admitting: Internal Medicine

## 2022-02-03 ENCOUNTER — Encounter: Payer: Self-pay | Admitting: Family Medicine

## 2022-02-06 ENCOUNTER — Ambulatory Visit (INDEPENDENT_AMBULATORY_CARE_PROVIDER_SITE_OTHER): Payer: BC Managed Care – PPO

## 2022-02-06 DIAGNOSIS — I4819 Other persistent atrial fibrillation: Secondary | ICD-10-CM

## 2022-02-06 LAB — CUP PACEART REMOTE DEVICE CHECK
Date Time Interrogation Session: 20231023165343
Implantable Pulse Generator Implant Date: 20230406

## 2022-02-07 ENCOUNTER — Ambulatory Visit: Payer: BC Managed Care – PPO | Admitting: Family Medicine

## 2022-02-07 VITALS — BP 120/78 | HR 72 | Ht 70.0 in | Wt 210.2 lb

## 2022-02-07 DIAGNOSIS — G4733 Obstructive sleep apnea (adult) (pediatric): Secondary | ICD-10-CM

## 2022-02-07 NOTE — Progress Notes (Signed)
   Subjective:    Patient ID: Alan Thompson, male    DOB: 1965/04/24, 56 y.o.   MRN: [id number]  HPI He is here for recheck.  He has been on [facility] for several months.  Presently he is on 1.7.  He was up to 2.4 but had difficulty with that causing nausea.  He notes more energy, stamina and general feeling of wellness since losing 39 pounds.  He still using his CPAP was interesting to see if he would qualify to come off of it.   Review of Systems     Objective:   Physical Exam Alert and in no distress otherwise not examined       Assessment & Plan:  OSA treated with BiPAP - Plan: Home sleep test We will do a sleep study to see how he does.  Cautioned that even though he is lost this weight he needs to continue on this medication forever to ensure that the weight stays off.

## 2022-02-15 ENCOUNTER — Encounter: Payer: Self-pay | Admitting: Family Medicine

## 2022-02-20 ENCOUNTER — Telehealth: Payer: Self-pay | Admitting: Family Medicine

## 2022-02-20 NOTE — Telephone Encounter (Signed)
P.A. WEGOVY 

## 2022-02-22 ENCOUNTER — Encounter: Payer: Self-pay | Admitting: Family Medicine

## 2022-02-24 ENCOUNTER — Telehealth: Payer: Self-pay | Admitting: Family Medicine

## 2022-02-24 NOTE — Telephone Encounter (Signed)
Pt called and wanted to know the status of his [facility] PA, he has ran out. His insurance called and told him they denied it due to some missing information and to try to amend it. I did let him know you were out until Monday and he stated he would call again then but also wanted me to send a message.

## 2022-03-06 ENCOUNTER — Telehealth: Payer: Self-pay | Admitting: Family Medicine

## 2022-03-06 NOTE — Telephone Encounter (Signed)
Pt advised.

## 2022-03-06 NOTE — Telephone Encounter (Signed)
Pt's insurance has denied wygovy and laura is working on a appeal.  She has sent everything in and is waiting on insurance company. Pt is out of medication and does not have any for tomorrow. He has been on med for eight months. He just wants to make sure it will not cause any issues for him just to stop. Please advise pt.

## 2022-03-07 NOTE — Telephone Encounter (Signed)
Called plan regarding PA & they couldn't confirm the last fax was received with the medical records.  I redid the P.A. over the phone & refaxed the medical records, pt's new ref# [id number] & should have response within 72 hours spoke with [employer]

## 2022-03-09 NOTE — Telephone Encounter (Signed)
P.A. approval received til 09/04/22, pt informed

## 2022-03-11 NOTE — Progress Notes (Signed)
Carelink Summary Report / Loop Recorder 

## 2022-03-13 ENCOUNTER — Ambulatory Visit (INDEPENDENT_AMBULATORY_CARE_PROVIDER_SITE_OTHER): Payer: BC Managed Care – PPO

## 2022-03-13 DIAGNOSIS — I4819 Other persistent atrial fibrillation: Secondary | ICD-10-CM

## 2022-03-14 LAB — CUP PACEART REMOTE DEVICE CHECK
Date Time Interrogation Session: 20231203232031
Implantable Pulse Generator Implant Date: 20230406

## 2022-03-17 ENCOUNTER — Ambulatory Visit (HOSPITAL_BASED_OUTPATIENT_CLINIC_OR_DEPARTMENT_OTHER): Payer: BC Managed Care – PPO | Attending: Family Medicine | Admitting: Internal Medicine

## 2022-03-17 DIAGNOSIS — G4733 Obstructive sleep apnea (adult) (pediatric): Secondary | ICD-10-CM | POA: Diagnosis not present

## 2022-03-25 DIAGNOSIS — G4733 Obstructive sleep apnea (adult) (pediatric): Secondary | ICD-10-CM | POA: Diagnosis not present

## 2022-03-25 NOTE — Procedures (Signed)
    Patient Name: Alan Thompson, Alan Thompson Date: 03/19/2022 Gender: Male D.O.B: 11-29-1965 Age (years): 39 Referring Provider: Denita Lung Height (inches): 24 Interpreting Physician: Baird Lyons MD, ABSM Weight (lbs): 199 RPSGT: Jacolyn Reedy BMI: 39 MRN: [id number] Neck Size: 16.00  CLINICAL INFORMATION Sleep Study Type: HST Indication for sleep study: OSA Epworth Sleepiness Score: 2  SLEEP STUDY TECHNIQUE A multi-channel overnight portable sleep study was performed. The channels recorded were: nasal airflow, thoracic respiratory movement, and oxygen saturation with a pulse oximetry. Snoring was also monitored.  MEDICATIONS Patient self administered medications include: none reported.  SLEEP ARCHITECTURE Patient was studied for 364.5 minutes. The sleep efficiency was 100.0 % and the patient was supine for 0%. The arousal index was 0.0 per hour.  RESPIRATORY PARAMETERS The overall AHI was 31.4 per hour, with a central apnea index of 0 per hour. The oxygen nadir was 86% during sleep.  CARDIAC DATA Mean heart rate during sleep was 73.0 bpm.  IMPRESSIONS - Severe obstructive sleep apnea occurred during this study (AHI = 31.4/h). - Moderate oxygen desaturation was noted during this study (Min O2 = 86%). Mean 94% - Patient snored.  DIAGNOSIS - Obstructive Sleep Apnea (G47.33)  RECOMMENDATIONS - Suggest CPAP titration sleep study or autopap. Other options would be based on clinical judgment. - Be careful with alcohol, sedatives and other CNS depressants that may worsen sleep apnea and disrupt normal sleep architecture. - Sleep hygiene should be reviewed to assess factors that may improve sleep quality. - Weight management and regular exercise should be initiated or continued.  [Electronically signed] 03/25/2022 11:56 AM  Baird Lyons MD, ABSM Diplomate, American Board of Sleep Medicine NPI: [id number]                         [place]  ELECTRONICALLY SIGNED ON:  03/25/2022, 11:52 AM [place] PH: [phone]   FX: [phone] [place]

## 2022-03-27 ENCOUNTER — Telehealth: Payer: Self-pay | Admitting: Internal Medicine

## 2022-03-27 NOTE — Telephone Encounter (Signed)
-----   Message from Denita Lung, MD sent at 03/27/2022  8:23 AM EST ----- Let him know that the sleep study indicates that he still needs to do the CPAP and get a readout on the CPAP so we can make sure that is working  ----- Message ----- From: Deneise Lever, MD Sent: 03/25/2022  11:59 AM EST To: Denita Lung, MD

## 2022-03-27 NOTE — Progress Notes (Signed)
See telephone call about CPAP

## 2022-03-27 NOTE — Telephone Encounter (Signed)
Pt was notified. However pt was with [facility] and they are no longer in business. We do not have access to compliance. I will need to refer him to some where else. If ok with you. I can do [facility] or [facility]

## 2022-03-28 NOTE — Telephone Encounter (Signed)
Faxed over order form to [facility] for CPap supplies

## 2022-04-17 ENCOUNTER — Ambulatory Visit (INDEPENDENT_AMBULATORY_CARE_PROVIDER_SITE_OTHER): Payer: BC Managed Care – PPO

## 2022-04-17 DIAGNOSIS — I4819 Other persistent atrial fibrillation: Secondary | ICD-10-CM

## 2022-04-18 LAB — CUP PACEART REMOTE DEVICE CHECK
Date Time Interrogation Session: 20240107232533
Implantable Pulse Generator Implant Date: 20230406

## 2022-04-21 DIAGNOSIS — G4733 Obstructive sleep apnea (adult) (pediatric): Secondary | ICD-10-CM | POA: Diagnosis not present

## 2022-04-21 NOTE — Progress Notes (Signed)
Carelink Summary Report / Loop Recorder

## 2022-05-09 ENCOUNTER — Other Ambulatory Visit: Payer: Self-pay | Admitting: Family Medicine

## 2022-05-09 ENCOUNTER — Encounter: Payer: Self-pay | Admitting: Family Medicine

## 2022-05-22 ENCOUNTER — Ambulatory Visit: Payer: BC Managed Care – PPO

## 2022-05-22 DIAGNOSIS — I4819 Other persistent atrial fibrillation: Secondary | ICD-10-CM | POA: Diagnosis not present

## 2022-05-23 LAB — CUP PACEART REMOTE DEVICE CHECK
Date Time Interrogation Session: 20240209231255
Implantable Pulse Generator Implant Date: 20230406

## 2022-05-30 NOTE — Progress Notes (Signed)
Carelink Summary Report / Loop Recorder 

## 2022-06-26 ENCOUNTER — Ambulatory Visit (INDEPENDENT_AMBULATORY_CARE_PROVIDER_SITE_OTHER): Payer: BC Managed Care – PPO

## 2022-06-26 DIAGNOSIS — I4819 Other persistent atrial fibrillation: Secondary | ICD-10-CM | POA: Diagnosis not present

## 2022-06-28 LAB — CUP PACEART REMOTE DEVICE CHECK
Date Time Interrogation Session: 20240317232513
Implantable Pulse Generator Implant Date: 20230406

## 2022-07-06 NOTE — Progress Notes (Signed)
Carelink Summary Report / Loop Recorder 

## 2022-07-25 ENCOUNTER — Other Ambulatory Visit: Payer: Self-pay | Admitting: Family Medicine

## 2022-07-25 DIAGNOSIS — E669 Obesity, unspecified: Secondary | ICD-10-CM

## 2022-07-25 MED ORDER — WEGOVY 2.4 MG/0.75ML ~~LOC~~ SOAJ: 2.4000 mg | SUBCUTANEOUS | 0 refills | Status: DC

## 2022-07-26 ENCOUNTER — Encounter: Payer: Self-pay | Admitting: Family Medicine

## 2022-07-28 NOTE — Telephone Encounter (Signed)
Authorization not required per cover my meds. PA information faxed to [facility].

## 2022-07-31 ENCOUNTER — Ambulatory Visit (INDEPENDENT_AMBULATORY_CARE_PROVIDER_SITE_OTHER): Payer: BC Managed Care – PPO

## 2022-07-31 DIAGNOSIS — I4819 Other persistent atrial fibrillation: Secondary | ICD-10-CM

## 2022-08-01 LAB — CUP PACEART REMOTE DEVICE CHECK
Date Time Interrogation Session: 20240419230858
Implantable Pulse Generator Implant Date: 20230406

## 2022-08-09 NOTE — Progress Notes (Signed)
Carelink Summary Report / Loop Recorder 

## 2022-08-30 ENCOUNTER — Ambulatory Visit (INDEPENDENT_AMBULATORY_CARE_PROVIDER_SITE_OTHER): Payer: BC Managed Care – PPO

## 2022-08-30 ENCOUNTER — Other Ambulatory Visit: Payer: Self-pay | Admitting: Family Medicine

## 2022-08-30 DIAGNOSIS — E669 Obesity, unspecified: Secondary | ICD-10-CM

## 2022-08-30 DIAGNOSIS — I4819 Other persistent atrial fibrillation: Secondary | ICD-10-CM

## 2022-08-31 LAB — CUP PACEART REMOTE DEVICE CHECK
Date Time Interrogation Session: 20240522231054
Implantable Pulse Generator Implant Date: 20230406

## 2022-09-06 NOTE — Progress Notes (Signed)
Carelink Summary Report / Loop Recorder 

## 2022-09-21 NOTE — Progress Notes (Signed)
Carelink Summary Report / Loop Recorder 

## 2022-09-28 NOTE — Progress Notes (Addendum)
[name] [address] [place] [address] Phone: [phone] Subjective:   Bruce Donath, am serving as a scribe for Dr. Antoine Primas.  I'm seeing this patient by the request  of:  Ronnald Nian, MD  CC: Right knee, hip and ankle pain  BJY:[id number]  INDRA OKELLEY is a 57 y.o. male coming in with complaint of knee, hip, and ankle pain. Patient states that he has had multiple surgeries in L knee. Most recent 1990 patellar tendon ACL repair bilaterally. TKR left in 2010 by Dr. Lajoyce Corners. Within last 2 years he was told R knee needs to be replaced. Lost 90 pounds in past 2 years. Likes to bike but is unable to walk due to pain in L knee, L hip and L ankle. Also mentions L groin strain a few weeks ago. Continued pain that is not better with activity.     Past Medical History:  Diagnosis Date   Allergy    RHINITIS   Arthritis    Asthma    GERD (gastroesophageal reflux disease)    H/O hiatal hernia    Hyperlipidemia    OSA (obstructive sleep apnea)    uses BiPAP   Persistent atrial fibrillation (HCC)    Sleep apnea 2016   bipap   Past Surgical History:  Procedure Laterality Date   ANTERIOR CRUCIATE LIGAMENT REPAIR     BILATERAL   ATRIAL FIBRILLATION ABLATION  06/2017   PVI at [facility] ini [place]   CARDIOVERSION N/A 07/08/2013   Procedure: CARDIOVERSION;  Surgeon: Lars Masson, MD;  Location: [facility] ENDOSCOPY;  Service: Cardiovascular;  Laterality: N/A;   CHOLECYSTECTOMY  2010   implantable loop recorder  06/01/2017   MDT Reveal LINQ implanted by Dr Marnette Burgess in [facility]   implantable loop recorder placement  07/14/2021   Medtronic Reveal [place] model [place] ([place] [id number]) implanted with prior ILR removed   TOTAL KNEE ARTHROPLASTY  2010   Social History   Socioeconomic History   Marital status: Married    Spouse name: Not on file   Number of children: Not on file   Years of education: Not on file   Highest education level: Not on file   Occupational History   Not on file  Tobacco Use   Smoking status: Former    Packs/day: 0.50    Years: 10.00    Additional pack years: 0.00    Total pack years: 5.00    Types: Cigarettes    Quit date: 1999    Years since quitting: 25.4   Smokeless tobacco: Never  Vaping Use   Vaping Use: Never used  Substance and Sexual Activity   Alcohol use: Yes    Alcohol/week: 1.0 standard drink of alcohol    Types: 1 Standard drinks or equivalent per week   Drug use: No   Sexual activity: Yes  Other Topics Concern   Not on file  Social History Narrative   Not on file   Social Determinants of Health   Financial Resource Strain: Not on file  Food Insecurity: Not on file  Transportation Needs: Not on file  Physical Activity: Not on file  Stress: Not on file  Social Connections: Not on file   No Known Allergies Family History  Problem Relation Age of Onset   Arrhythmia Mother        atrial fibrillation   Prostate cancer Father    Colon cancer Neg Hx    Colon polyps Neg Hx  Esophageal cancer Neg Hx    Rectal cancer Neg Hx    Stomach cancer Neg Hx         Current Outpatient Medications (Respiratory):    albuterol (VENTOLIN HFA) 108 (90 Base) MCG/ACT inhaler,    cetirizine (ZYRTEC) 10 MG tablet, Take 10 mg by mouth daily as needed for allergies or rhinitis.   fluticasone (FLONASE) 50 MCG/ACT nasal spray, Place 2 sprays into both nostrils daily as needed for allergies or rhinitis.   Current Outpatient Medications (Analgesics):    aspirin EC 81 MG tablet, Take 1 tablet (81 mg total) by mouth daily.     Current Outpatient Medications (Other):    Multiple Vitamin (MULTIVITAMIN ADULT PO), Take by mouth.   olopatadine (PATANOL) 0.1 % ophthalmic solution, Place 1 drop into both eyes 2 (two) times daily.   omeprazole (PRILOSEC) 40 MG capsule, TAKE 1 CAPSULE BY MOUTH EVERY DAY   Vitamin D, Ergocalciferol, (DRISDOL) 1.25 MG (50000 UNIT) CAPS capsule, Take 1 capsule (50,000  Units total) by mouth every 7 (seven) days.   WEGOVY 2.4 MG/0.75ML SOAJ, INJECT 2.4 MG INTO THE SKIN ONCE A WEEK  Current Facility-Administered Medications (Other):    0.9 %  sodium chloride infusion   Reviewed prior external information including notes and imaging from  primary care provider As well as notes that were available from care everywhere and other healthcare systems.  Past medical history, social, surgical and family history all reviewed in electronic medical record.  No pertanent information unless stated regarding to the chief complaint.   Review of Systems:  No headache, visual changes, nausea, vomiting, diarrhea, constipation, dizziness, abdominal pain, skin rash, fevers, chills, night sweats, weight loss, swollen lymph nodes,, joint swelling, chest pain, shortness of breath, mood changes. POSITIVE muscle aches, body aches  Objective  Blood pressure 110/82, pulse 77, height 5\' 10"  (1.778 m), SpO2 97 %.   General: No apparent distress alert and oriented x3 mood and affect normal, dressed appropriately.  HEENT: Pupils equal, extraocular movements intact  Respiratory: Patient's speak in full sentences and does not appear short of breath  Cardiovascular: No lower extremity edema, non tender, no erythema  Antalgic gait noted.  Patient is ambulatory though.  Right knee does have a trace effusion noted.  No crepitus noted.  Instability noted with valgus and varus force. Patient's left knee also does have an effusion noted.  Does seem to have some instability with valgus and varus force.  Flexion to 100 degrees though.  Will follow-up extension noted.  Significant scar from previous surgical intervention noted.  After informed written and verbal consent, patient was seated on exam table. Right knee was prepped with alcohol swab and utilizing anterolateral approach, patient's right knee space was injected with 4:1  marcaine 0.5%: Kenalog 40mg /dL. Patient tolerated the procedure well  without immediate complications.   Impression and Recommendations:     The above documentation has been reviewed and is accurate and complete Judi Saa, DO

## 2022-10-02 ENCOUNTER — Ambulatory Visit: Payer: BC Managed Care – PPO

## 2022-10-02 DIAGNOSIS — I4819 Other persistent atrial fibrillation: Secondary | ICD-10-CM

## 2022-10-03 ENCOUNTER — Ambulatory Visit: Payer: BC Managed Care – PPO | Admitting: Family Medicine

## 2022-10-03 ENCOUNTER — Encounter: Payer: Self-pay | Admitting: Family Medicine

## 2022-10-03 ENCOUNTER — Ambulatory Visit (INDEPENDENT_AMBULATORY_CARE_PROVIDER_SITE_OTHER): Payer: BC Managed Care – PPO

## 2022-10-03 VITALS — BP 110/82 | HR 77 | Ht 70.0 in

## 2022-10-03 DIAGNOSIS — G8929 Other chronic pain: Secondary | ICD-10-CM

## 2022-10-03 DIAGNOSIS — M25561 Pain in right knee: Secondary | ICD-10-CM

## 2022-10-03 DIAGNOSIS — Z96652 Presence of left artificial knee joint: Secondary | ICD-10-CM | POA: Diagnosis not present

## 2022-10-03 DIAGNOSIS — M1711 Unilateral primary osteoarthritis, right knee: Secondary | ICD-10-CM

## 2022-10-03 DIAGNOSIS — T8484XA Pain due to internal orthopedic prosthetic devices, implants and grafts, initial encounter: Secondary | ICD-10-CM | POA: Diagnosis not present

## 2022-10-03 DIAGNOSIS — M25562 Pain in left knee: Secondary | ICD-10-CM

## 2022-10-03 DIAGNOSIS — M25462 Effusion, left knee: Secondary | ICD-10-CM | POA: Diagnosis not present

## 2022-10-03 DIAGNOSIS — Z471 Aftercare following joint replacement surgery: Secondary | ICD-10-CM | POA: Diagnosis not present

## 2022-10-03 DIAGNOSIS — M2170 Unequal limb length (acquired), unspecified site: Secondary | ICD-10-CM

## 2022-10-03 LAB — CUP PACEART REMOTE DEVICE CHECK
Date Time Interrogation Session: 20240624231052
Implantable Pulse Generator Implant Date: 20230406

## 2022-10-03 MED ORDER — VITAMIN D (ERGOCALCIFEROL) 1.25 MG (50000 UNIT) PO CAPS
50000.0000 [IU] | ORAL_CAPSULE | ORAL | 0 refills | Status: DC
Start: 2022-10-03 — End: 2024-02-20

## 2022-10-03 NOTE — Assessment & Plan Note (Signed)
I believe the patient does have an acquired leg length discrepancy noted.  Discussed 1/4 inch heel lift on the right side that will be beneficial.  Discussed recovery sandals in the house.  Hopeful that this will make a difference as well in some of his hip pain.  Follow-up given 6-8 weeks

## 2022-10-03 NOTE — Assessment & Plan Note (Signed)
Audible popping noted.  Patient is tender to palpation.  Possible small effusion.  Will get bone scan to make sure that it is appropriately seated.  May need laboratory workup as well if this continues.

## 2022-10-03 NOTE — Patient Instructions (Addendum)
Custom Medial Unloader R knee Bone scan L knee Once weekly Vit D K2 for one month Turmeric 500mg  daily Tart cherry extract 1200mg  at night OOFOS sandals Will get gel approval See me in 7-8 weeks

## 2022-10-03 NOTE — Assessment & Plan Note (Signed)
Patient given injection and tolerated the procedure well, discussed icing regimen and home exercises, discussed which activities to do and which ones to avoid.  Increase activity slowly.  Follow-up again in 6 to 8 weeks patient could be a candidate for viscosupplementation.  Patient is ambulatory but does have an abnormal thigh to calf ratio as well as instability of the knee with valgus and varus force.  Will see if a custom medial unloader stability brace would be beneficial.

## 2022-10-06 ENCOUNTER — Encounter: Payer: Self-pay | Admitting: Family Medicine

## 2022-10-06 MED ORDER — WEGOVY 1.7 MG/0.75ML ~~LOC~~ SOAJ: 1.7000 mg | SUBCUTANEOUS | 1 refills | Status: DC

## 2022-10-07 ENCOUNTER — Telehealth: Payer: Self-pay | Admitting: Internal Medicine

## 2022-10-07 NOTE — Telephone Encounter (Signed)
P.A [phone] sent through covermymeds. Waiting on response

## 2022-10-09 NOTE — Addendum Note (Signed)
Addended by: Judi Saa on: 10/09/2022 04:18 PM   Modules accepted: Level of Service

## 2022-10-10 NOTE — Telephone Encounter (Signed)
P.A. denied, needs documentation of weight loss,  pt hasn't been in recently for appt.  Called pt & scheduled an appt

## 2022-10-17 ENCOUNTER — Ambulatory Visit: Payer: BC Managed Care – PPO | Admitting: Family Medicine

## 2022-10-17 ENCOUNTER — Encounter: Payer: Self-pay | Admitting: Family Medicine

## 2022-10-17 VITALS — BP 114/76 | HR 67 | Temp 97.6°F | Resp 16 | Ht 70.0 in | Wt 178.8 lb

## 2022-10-17 DIAGNOSIS — Z1321 Encounter for screening for nutritional disorder: Secondary | ICD-10-CM

## 2022-10-17 DIAGNOSIS — G4733 Obstructive sleep apnea (adult) (pediatric): Secondary | ICD-10-CM

## 2022-10-17 DIAGNOSIS — M1710 Unilateral primary osteoarthritis, unspecified knee: Secondary | ICD-10-CM | POA: Diagnosis not present

## 2022-10-17 DIAGNOSIS — R634 Abnormal weight loss: Secondary | ICD-10-CM | POA: Diagnosis not present

## 2022-10-17 NOTE — Progress Notes (Signed)
   Subjective:    Patient ID: Alan Thompson, male    DOB: 09-Mar-1966, 57 y.o.   MRN: [id number]  HPI He is here for follow-up visit.  He has been on Wegovy 2.4 but this summer because of difficulty with nausea he was reduced to 1.7.  He continues to have very good weight loss.  He has lost 78 pounds since we started him on medication.  He has had some difficulty with knee pain and was seen and evaluated for that.  Apparently x-rays did show some microfracture and he was placed on vitamin D to see if this would help.  He can ride a bike without difficulty but walking any significant distance causes discomfort.  Also he has a history of OSA however evaluation showed continued difficulty with OSA to go ahead and stop using the CPAP and states that he seems to doing quite nicely off the device.   Review of Systems     Objective:    Physical Exam Alert and in no distress otherwise not examined       Assessment & Plan:   Problem List Items Addressed This Visit     OSA treated with BiPAP   Other Visit Diagnoses     Loss of weight    -  Primary   Relevant Orders   CBC with Differential/Platelet   Comprehensive metabolic panel   Lipid panel   Arthritis of knee       Encounter for vitamin deficiency screening       Relevant Orders   VITAMIN D 25 Hydroxy (Vit-D Deficiency, Fractures)     He is doing quite nicely off the CPAP and I concurred with him stopping it.  We can reevaluate this at any time. I will keep him on the 1.7 of Wegovy.  His weight seems to be fairly stable and he is very happy with this. Discussed the treatment of arthritis and encouraged him to continue as physically active as possible.  Discussed strengthening exercises for his knee.

## 2022-10-18 LAB — COMPREHENSIVE METABOLIC PANEL
ALT: 18 IU/L (ref 0–44)
AST: 18 IU/L (ref 0–40)
Albumin: 4.7 g/dL (ref 3.8–4.9)
Alkaline Phosphatase: 61 IU/L (ref 44–121)
BUN/Creatinine Ratio: 16 (ref 9–20)
BUN: 17 mg/dL (ref 6–24)
Bilirubin Total: 2 mg/dL — ABNORMAL HIGH (ref 0.0–1.2)
CO2: 26 mmol/L (ref 20–29)
Calcium: 9.9 mg/dL (ref 8.7–10.2)
Chloride: 102 mmol/L (ref 96–106)
Creatinine, Ser: 1.05 mg/dL (ref 0.76–1.27)
Globulin, Total: 2.8 g/dL (ref 1.5–4.5)
Glucose: 86 mg/dL (ref 70–99)
Potassium: 5.2 mmol/L (ref 3.5–5.2)
Sodium: 138 mmol/L (ref 134–144)
Total Protein: 7.5 g/dL (ref 6.0–8.5)
eGFR: 84 mL/min/{1.73_m2} (ref >59)

## 2022-10-18 LAB — CBC WITH DIFFERENTIAL/PLATELET
Basophils Absolute: 0 10*3/uL (ref 0.0–0.2)
Basos: 1 %
EOS (ABSOLUTE): 0.1 10*3/uL (ref 0.0–0.4)
Eos: 3 %
Hematocrit: 43.8 % (ref 37.5–51.0)
Hemoglobin: 14.5 g/dL (ref 13.0–17.7)
Immature Grans (Abs): 0 10*3/uL (ref 0.0–0.1)
Immature Granulocytes: 0 %
Lymphocytes Absolute: 2 10*3/uL (ref 0.7–3.1)
Lymphs: 39 %
MCH: 31.3 pg (ref 26.6–33.0)
MCHC: 33.1 g/dL (ref 31.5–35.7)
MCV: 94 fL (ref 79–97)
Monocytes Absolute: 0.3 10*3/uL (ref 0.1–0.9)
Monocytes: 6 %
Neutrophils Absolute: 2.6 10*3/uL (ref 1.4–7.0)
Neutrophils: 51 %
Platelets: 232 10*3/uL (ref 150–450)
RBC: 4.64 x10E6/uL (ref 4.14–5.80)
RDW: 11.3 % — ABNORMAL LOW (ref 11.6–15.4)
WBC: 5 10*3/uL (ref 3.4–10.8)

## 2022-10-18 LAB — LIPID PANEL
Chol/HDL Ratio: 3.3 ratio (ref 0.0–5.0)
Cholesterol, Total: 223 mg/dL — ABNORMAL HIGH (ref 100–199)
HDL: 67 mg/dL (ref >39)
LDL Chol Calc (NIH): 129 mg/dL — ABNORMAL HIGH (ref 0–99)
Triglycerides: 153 mg/dL — ABNORMAL HIGH (ref 0–149)
VLDL Cholesterol Cal: 27 mg/dL (ref 5–40)

## 2022-10-18 LAB — VITAMIN D 25 HYDROXY (VIT D DEFICIENCY, FRACTURES): Vit D, 25-Hydroxy: 72.7 ng/mL (ref 30.0–100.0)

## 2022-10-23 NOTE — Progress Notes (Signed)
 Carelink Summary Report / Loop Recorder 

## 2022-11-01 ENCOUNTER — Telehealth: Payer: Self-pay | Admitting: Family Medicine

## 2022-11-01 DIAGNOSIS — M1711 Unilateral primary osteoarthritis, right knee: Secondary | ICD-10-CM | POA: Diagnosis not present

## 2022-11-01 NOTE — Telephone Encounter (Signed)
Resent P.A. (Key: [id number]) Alan Thompson 1.7MG /0.75ML auto-injectors Form Chief Operating Officer

## 2022-11-02 NOTE — Telephone Encounter (Signed)
P.A. approved til 04/30/23,  called pt and informed

## 2022-11-06 ENCOUNTER — Ambulatory Visit (INDEPENDENT_AMBULATORY_CARE_PROVIDER_SITE_OTHER): Payer: BC Managed Care – PPO

## 2022-11-06 DIAGNOSIS — I4819 Other persistent atrial fibrillation: Secondary | ICD-10-CM | POA: Diagnosis not present

## 2022-11-06 LAB — CUP PACEART REMOTE DEVICE CHECK
Date Time Interrogation Session: 20240727230651
Implantable Pulse Generator Implant Date: 20230406

## 2022-11-08 ENCOUNTER — Ambulatory Visit (HOSPITAL_COMMUNITY)
Admission: RE | Admit: 2022-11-08 | Discharge: 2022-11-08 | Disposition: A | Discharge status: Home / Self Care | Payer: BC Managed Care – PPO | Source: Ambulatory Visit | Attending: Family Medicine | Admitting: Family Medicine

## 2022-11-08 DIAGNOSIS — M25562 Pain in left knee: Secondary | ICD-10-CM | POA: Insufficient documentation

## 2022-11-08 DIAGNOSIS — M1711 Unilateral primary osteoarthritis, right knee: Secondary | ICD-10-CM | POA: Diagnosis not present

## 2022-11-08 DIAGNOSIS — G8929 Other chronic pain: Secondary | ICD-10-CM | POA: Insufficient documentation

## 2022-11-08 DIAGNOSIS — Z96652 Presence of left artificial knee joint: Secondary | ICD-10-CM | POA: Diagnosis not present

## 2022-11-08 MED ORDER — TECHNETIUM TC 99M MEDRONATE IV KIT
20.0000 | PACK | Freq: Once | INTRAVENOUS | Status: AC | PRN
  Administered 2022-11-08: 20 via INTRAVENOUS

## 2022-11-16 ENCOUNTER — Encounter: Payer: Self-pay | Admitting: Family Medicine

## 2022-11-21 ENCOUNTER — Ambulatory Visit (INDEPENDENT_AMBULATORY_CARE_PROVIDER_SITE_OTHER): Payer: Self-pay | Admitting: Family Medicine

## 2022-11-21 ENCOUNTER — Ambulatory Visit: Payer: BC Managed Care – PPO | Admitting: Family Medicine

## 2022-11-21 ENCOUNTER — Encounter: Payer: Self-pay | Admitting: Family Medicine

## 2022-11-21 VITALS — Ht 70.0 in

## 2022-11-21 DIAGNOSIS — M1711 Unilateral primary osteoarthritis, right knee: Secondary | ICD-10-CM

## 2022-11-21 NOTE — Progress Notes (Signed)
  [name] [address] [place] [address] Phone: [phone] Subjective:    I'm seeing this patient by the request  of:  Ronnald Nian, MD  CC: right knee pain   [id number]  Alan Thompson is a 57 y.o. male coming in with complaint of B knee pain. Patient states continues to have pain at this time.  Known arthritic changes and is decided to do the PRP.       Past Medical History:  Diagnosis Date   Allergy    RHINITIS   Arthritis    Asthma    GERD (gastroesophageal reflux disease)    H/O hiatal hernia    Hyperlipidemia    OSA (obstructive sleep apnea)    uses BiPAP   Persistent atrial fibrillation (HCC)    Sleep apnea 2016   bipap   Past Surgical History:  Procedure Laterality Date   ANTERIOR CRUCIATE LIGAMENT REPAIR     BILATERAL   ATRIAL FIBRILLATION ABLATION  06/2017   PVI at [facility] [place]   CARDIOVERSION N/A 07/08/2013   Procedure: CARDIOVERSION;  Surgeon: Lars Masson, MD;  Location: [facility] ENDOSCOPY;  Service: Cardiovascular;  Laterality: N/A;   CHOLECYSTECTOMY  2010   implantable loop recorder  06/01/2017   MDT Reveal LINQ implanted by Dr Marnette Burgess in [facility]   implantable loop recorder placement  07/14/2021   Medtronic Reveal [place] model [place] ([place] [id number]) implanted with prior ILR removed   TOTAL KNEE ARTHROPLASTY  2010   Social History   Socioeconomic History   Marital status: Married    Spouse name: Not on file   Number of children: Not on file   Years of education: Not on file   Highest education level: Not on file  Occupational History   Not on file  Tobacco Use   Smoking status: Former    Current packs/day: 0.00    Average packs/day: 0.5 packs/day for 10.0 years (5.0 ttl pk-yrs)    Types: Cigarettes    Start date: 5    Quit date: 1999    Years since quitting: 25.6   Smokeless tobacco: Never  Vaping Use   Vaping status: Never Used  Substance and Sexual Activity   Alcohol use: Yes     Alcohol/week: 1.0 standard drink of alcohol    Types: 1 Standard drinks or equivalent per week   Drug use: No   Sexual activity: Yes  Other Topics Concern   Not on file  Social History Narrative   Not on file   Social Determinants of Health   Financial Resource Strain: Not on file  Food Insecurity: Not on file  Transportation Needs: Not on file  Physical Activity: Not on file  Stress: Not on file  Social Connections: Not on file    Objective  Height 5\' 10"  (1.778 m).   General: No apparent distress alert and oriented x3 mood and affect normal, dressed appropriately.  Right knee exam does have some crepitus noted.  After informed written and verbal consent, patient was seated on exam table. Right knee was prepped with alcohol swab and utilizing anterolateral approach, patient's right knee space was injected with 2 cc of 0.5% Marcaine then injected with 5 cc of PRP leukocyte poor. patient tolerated the procedure well without immediate complications.   Impression and Recommendations:    The above documentation has been reviewed and is accurate and complete Judi Saa, DO

## 2022-11-21 NOTE — Assessment & Plan Note (Signed)
Post PRP instructions given.  Follow-up again in 6 to 8 weeks

## 2022-11-21 NOTE — Progress Notes (Signed)
Entered in error

## 2022-11-21 NOTE — Patient Instructions (Signed)
No ice or IBU for 3 days Heat and Tylenol are ok See me again in 6 weeks 

## 2022-11-23 ENCOUNTER — Ambulatory Visit: Payer: BC Managed Care – PPO | Admitting: Family Medicine

## 2022-11-23 NOTE — Progress Notes (Signed)
Carelink Summary Report / Loop Recorder 

## 2022-12-04 ENCOUNTER — Encounter: Payer: BC Managed Care – PPO | Admitting: Family Medicine

## 2022-12-06 ENCOUNTER — Ambulatory Visit: Payer: BC Managed Care – PPO | Admitting: Family Medicine

## 2022-12-06 ENCOUNTER — Encounter: Payer: Self-pay | Admitting: Family Medicine

## 2022-12-06 VITALS — BP 110/72 | HR 69 | Ht 70.5 in | Wt 174.2 lb

## 2022-12-06 DIAGNOSIS — E669 Obesity, unspecified: Secondary | ICD-10-CM | POA: Diagnosis not present

## 2022-12-06 DIAGNOSIS — I340 Nonrheumatic mitral (valve) insufficiency: Secondary | ICD-10-CM | POA: Diagnosis not present

## 2022-12-06 DIAGNOSIS — J301 Allergic rhinitis due to pollen: Secondary | ICD-10-CM

## 2022-12-06 DIAGNOSIS — Z8679 Personal history of other diseases of the circulatory system: Secondary | ICD-10-CM

## 2022-12-06 DIAGNOSIS — J4520 Mild intermittent asthma, uncomplicated: Secondary | ICD-10-CM

## 2022-12-06 DIAGNOSIS — K219 Gastro-esophageal reflux disease without esophagitis: Secondary | ICD-10-CM

## 2022-12-06 DIAGNOSIS — M1711 Unilateral primary osteoarthritis, right knee: Secondary | ICD-10-CM | POA: Diagnosis not present

## 2022-12-06 DIAGNOSIS — M1990 Unspecified osteoarthritis, unspecified site: Secondary | ICD-10-CM

## 2022-12-06 DIAGNOSIS — G4733 Obstructive sleep apnea (adult) (pediatric): Secondary | ICD-10-CM

## 2022-12-06 NOTE — Progress Notes (Signed)
   Subjective:    Patient ID: Alan Thompson, male    DOB: 04/02/1966, 57 y.o.   MRN: [id number]  HPI He is here for complete examination.  He continues to do quite nicely on his [place].  He is followed by a sports medicine specialist mainly for his right knee although he has had the left knee replaced and has had some difficulty with that.  He did have PRP for the right knee but is not sure if it really helped much.  His reflux seems to be under good control.  He is also treating his allergies and does occasionally have difficulty with allergy related asthma. He also has a history of mitral regurg and atrial fibs but none at the present time.  His work and home life are going quite well.  He has no other concerns or complaints.  Review of Systems  All other systems reviewed and are negative.      Objective:    Physical Exam Alert and in no distress. Tympanic membranes and canals are normal. Pharyngeal area is normal. Neck is supple without adenopathy or thyromegaly. Cardiac exam shows a regular sinus rhythm without murmurs or gallops. Lungs are clear to auscultation.        Assessment & Plan:   Problem List Items Addressed This Visit     Allergic rhinitis, seasonal   Arthritis   Asthma in adult   Degenerative arthritis of right knee   Gastroesophageal reflux disease without esophagitis   History of atrial fibrillation   Mitral regurgitation   Obesity (BMI 30.0-34.9)   OSA treated with BiPAP - Primary  He will continue on his present medication regimen including Wegovy.  Discussed the fact that even though he is stable we would want to continue this medication since stopping it, the patient can gain the weight back.  Plans to come in later for flu and COVID shot.

## 2022-12-10 LAB — CUP PACEART REMOTE DEVICE CHECK
Date Time Interrogation Session: 20240829231119
Implantable Pulse Generator Implant Date: 20230406

## 2022-12-12 ENCOUNTER — Ambulatory Visit (INDEPENDENT_AMBULATORY_CARE_PROVIDER_SITE_OTHER): Payer: BC Managed Care – PPO

## 2022-12-12 DIAGNOSIS — I4819 Other persistent atrial fibrillation: Secondary | ICD-10-CM | POA: Diagnosis not present

## 2022-12-25 NOTE — Progress Notes (Signed)
Carelink Summary Report / Loop Recorder 

## 2023-01-01 NOTE — Progress Notes (Unsigned)
[name] [address] [place] [address] Phone: [phone] Subjective:   INadine Counts, am serving as a scribe for Dr. Antoine Primas.  I'm seeing this patient by the request  of:  Ronnald Nian, MD  CC: Bilateral knee pain follow-up  ION:[id number]  11/21/2022 Post PRP instructions given.  Follow-up again in 6 to 8 weeks     Updated 01/02/2023 FINUS YANKE is a 57 y.o. male coming in with complaint of R knee pain. PRP f/u. Feels like he's doing just fine. L groin pain that hasn't gone away. L knee pain that started after wearing the lift on the right side.       Past Medical History:  Diagnosis Date   Allergy    RHINITIS   Arthritis    Asthma    GERD (gastroesophageal reflux disease)    H/O hiatal hernia    Hyperlipidemia    OSA (obstructive sleep apnea)    uses BiPAP   Persistent atrial fibrillation (HCC)    Sleep apnea 2016   bipap   Past Surgical History:  Procedure Laterality Date   ANTERIOR CRUCIATE LIGAMENT REPAIR     BILATERAL   ATRIAL FIBRILLATION ABLATION  06/2017   PVI at [facility] [place]   CARDIOVERSION N/A 07/08/2013   Procedure: CARDIOVERSION;  Surgeon: Lars Masson, MD;  Location: [facility] ENDOSCOPY;  Service: Cardiovascular;  Laterality: N/A;   CHOLECYSTECTOMY  2010   implantable loop recorder  06/01/2017   MDT Reveal LINQ implanted by Dr Marnette Burgess in [facility]   implantable loop recorder placement  07/14/2021   Medtronic Reveal [place] model [place] ([place] [id number]) implanted with prior ILR removed   TOTAL KNEE ARTHROPLASTY  2010   Social History   Socioeconomic History   Marital status: Married    Spouse name: Not on file   Number of children: Not on file   Years of education: Not on file   Highest education level: Not on file  Occupational History   Not on file  Tobacco Use   Smoking status: Former    Current packs/day: 0.00    Average packs/day: 0.5 packs/day for 10.0 years (5.0 ttl pk-yrs)    Types:  Cigarettes    Start date: 10    Quit date: 1999    Years since quitting: 25.7   Smokeless tobacco: Never  Vaping Use   Vaping status: Never Used  Substance and Sexual Activity   Alcohol use: Yes    Alcohol/week: 1.0 standard drink of alcohol    Types: 1 Standard drinks or equivalent per week   Drug use: No   Sexual activity: Yes  Other Topics Concern   Not on file  Social History Narrative   Not on file   Social Determinants of Health   Financial Resource Strain: Low Risk  (12/06/2022)   Overall Financial Resource Strain (CARDIA)    Difficulty of Paying Living Expenses: Not hard at all  Food Insecurity: No Food Insecurity (12/06/2022)   Hunger Vital Sign    Worried About Running Out of Food in the Last Year: Never true    Ran Out of Food in the Last Year: Never true  Transportation Needs: No Transportation Needs (12/06/2022)   PRAPARE - Administrator, Civil Service (Medical): No    Lack of Transportation (Non-Medical): No  Physical Activity: Sufficiently Active (12/06/2022)   Exercise Vital Sign    Days of Exercise per Week: 3 days  Minutes of Exercise per Session: 60 min  Stress: No Stress Concern Present (12/06/2022)   [employer] of Occupational Health - Occupational Stress Questionnaire    Feeling of Stress : Not at all  Social Connections: Moderately Integrated (12/06/2022)   Social Connection and Isolation Panel [NHANES]    Frequency of Communication with Friends and Family: More than three times a week    Frequency of Social Gatherings with Friends and Family: More than three times a week    Attends Religious Services: Never    Database administrator or Organizations: Yes    Attends Engineer, structural: 1 to 4 times per year    Marital Status: Married   No Known Allergies Family History  Problem Relation Age of Onset   Arrhythmia Mother        atrial fibrillation   Prostate cancer Father    Colon cancer Neg Hx    Colon polyps Neg  Hx    Esophageal cancer Neg Hx    Rectal cancer Neg Hx    Stomach cancer Neg Hx         Current Outpatient Medications (Respiratory):    montelukast (SINGULAIR) 10 MG tablet, Take 1 tablet (10 mg total) by mouth at bedtime.   albuterol (VENTOLIN HFA) 108 (90 Base) MCG/ACT inhaler,    cetirizine (ZYRTEC) 10 MG tablet, Take 10 mg by mouth daily as needed for allergies or rhinitis.   fluticasone (FLONASE) 50 MCG/ACT nasal spray, Place 2 sprays into both nostrils daily as needed for allergies or rhinitis. (Patient not taking: Reported on 12/06/2022)   Current Outpatient Medications (Analgesics):    aspirin EC 81 MG tablet, Take 1 tablet (81 mg total) by mouth daily.     Current Outpatient Medications (Other):    Multiple Vitamin (MULTIVITAMIN ADULT PO), Take by mouth.   olopatadine (PATANOL) 0.1 % ophthalmic solution, Place 1 drop into both eyes 2 (two) times daily. (Patient not taking: Reported on 12/06/2022)   omeprazole (PRILOSEC) 40 MG capsule, TAKE 1 CAPSULE BY MOUTH EVERY DAY   Semaglutide-Weight Management (WEGOVY) 1.7 MG/0.75ML SOAJ, Inject 1.7 mg into the skin once a week.   Vitamin D, Ergocalciferol, (DRISDOL) 1.25 MG (50000 UNIT) CAPS capsule, Take 1 capsule (50,000 Units total) by mouth every 7 (seven) days.  Current Facility-Administered Medications (Other):    0.9 %  sodium chloride infusion   Reviewed prior external information including notes and imaging from  primary care provider As well as notes that were available from care everywhere and other healthcare systems.  Past medical history, social, surgical and family history all reviewed in electronic medical record.  No pertanent information unless stated regarding to the chief complaint.   Review of Systems:  No headache, visual changes, nausea, vomiting, diarrhea, constipation, dizziness, abdominal pain, skin rash, fevers, chills, night sweats, weight loss, swollen lymph nodes, body aches, joint swelling, chest  pain, shortness of breath, mood changes. POSITIVE muscle aches  Objective  Blood pressure 104/66, pulse 69, height 5\' 10"  (1.778 m), weight 178 lb (80.7 kg), SpO2 96%.   General: No apparent distress alert and oriented x3 mood and affect normal, dressed appropriately.  HEENT: Pupils equal, extraocular movements intact  Respiratory: Patient's speak in full sentences and does not appear short of breath  Cardiovascular: No lower extremity edema, non tender, no erythema  Left knee does have a knee replacement.  Still has the audible noise and some mild increase in range of motion with valgus and varus stress.  Patient does have lacking the last 5 degrees of flexion noted.   Limited muscular skeletal ultrasound was performed and interpreted by Antoine Primas, M   Limited ultrasound shows the patient has significant less hypoechoic changes noted in the right knee.  Significant decrease in the inflammation and the synovitis that was noted previously.  Left knee still has trace effusion noted Seems to have some scar tissue formation also noted in the patellofemoral area.  Impression: Significant improvement in the right knee but continued effusion of the left knee replacement   Impression and Recommendations:    The above documentation has been reviewed and is accurate and complete Judi Saa, DO

## 2023-01-02 ENCOUNTER — Ambulatory Visit: Payer: BC Managed Care – PPO | Admitting: Family Medicine

## 2023-01-02 ENCOUNTER — Other Ambulatory Visit: Payer: Self-pay | Admitting: Family Medicine

## 2023-01-02 ENCOUNTER — Encounter: Payer: Self-pay | Admitting: Family Medicine

## 2023-01-02 ENCOUNTER — Other Ambulatory Visit: Payer: Self-pay

## 2023-01-02 VITALS — BP 104/66 | HR 69 | Ht 70.0 in | Wt 178.0 lb

## 2023-01-02 DIAGNOSIS — M1711 Unilateral primary osteoarthritis, right knee: Secondary | ICD-10-CM | POA: Diagnosis not present

## 2023-01-02 DIAGNOSIS — Z96652 Presence of left artificial knee joint: Secondary | ICD-10-CM

## 2023-01-02 DIAGNOSIS — T8484XA Pain due to internal orthopedic prosthetic devices, implants and grafts, initial encounter: Secondary | ICD-10-CM | POA: Diagnosis not present

## 2023-01-02 MED ORDER — MONTELUKAST SODIUM 10 MG PO TABS: 10.0000 mg | ORAL_TABLET | Freq: Every day | ORAL | 0 refills | Status: DC

## 2023-01-02 NOTE — Patient Instructions (Signed)
Singular 10 mg at night Ice below knee after activity Thigh compression with activity See you again in 2 months

## 2023-01-02 NOTE — Assessment & Plan Note (Signed)
Significant improvement with the PRP at this time.  Surprisingly doing fantastic.  Hopefully will do well over the course of time.  Discussed icing regimen and home exercises, discussed which activities could be beneficial in which ones could be more difficult.  Follow-up with me again in 6 to 8 weeks otherwise.

## 2023-01-02 NOTE — Assessment & Plan Note (Signed)
Patient will be started on Singulair, we will see how patient responds to this.  Discussed icing regimen and home exercises.  Could get a custom brace as needed.

## 2023-01-10 LAB — CUP PACEART REMOTE DEVICE CHECK
Date Time Interrogation Session: 20241001231047
Implantable Pulse Generator Implant Date: 20230406

## 2023-01-15 ENCOUNTER — Ambulatory Visit: Payer: BC Managed Care – PPO

## 2023-01-15 DIAGNOSIS — I4819 Other persistent atrial fibrillation: Secondary | ICD-10-CM

## 2023-02-01 NOTE — Progress Notes (Signed)
Carelink Summary Report / Loop Recorder 

## 2023-02-14 LAB — CUP PACEART REMOTE DEVICE CHECK
Date Time Interrogation Session: 20241103231023
Implantable Pulse Generator Implant Date: 20230406

## 2023-02-19 ENCOUNTER — Ambulatory Visit (INDEPENDENT_AMBULATORY_CARE_PROVIDER_SITE_OTHER): Payer: BC Managed Care – PPO

## 2023-02-19 DIAGNOSIS — I4819 Other persistent atrial fibrillation: Secondary | ICD-10-CM

## 2023-03-02 NOTE — Progress Notes (Signed)
[name] [address] [place] [address] Phone: [phone] Subjective:   Bruce Donath, am serving as a scribe for Dr. Antoine Primas.  I'm seeing this patient by the request  of:  Ronnald Nian, MD  CC: Bilateral knee pain  QIH:[id number]  01/02/2023 Patient will be started on Singulair, we will see how patient responds to this.  Discussed icing regimen and home exercises.  Could get a custom brace as needed.     Significant improvement with the PRP at this time.  Surprisingly doing fantastic.  Hopefully will do well over the course of time.  Discussed icing regimen and home exercises, discussed which activities could be beneficial in which ones could be more difficult.  Follow-up with me again in 6 to 8 weeks otherwise.     Updated 03/13/2023 FINUS YANKE is a 57 y.o. male coming in with complaint of B knee pain, seem to be worse on the right side.  PRP injection given in August. Patient has been doing much better. R knee pain is only present when he palpates medial tibia.       Past Medical History:  Diagnosis Date   Allergy    RHINITIS   Arthritis    Asthma    GERD (gastroesophageal reflux disease)    H/O hiatal hernia    Hyperlipidemia    OSA (obstructive sleep apnea)    uses BiPAP   Persistent atrial fibrillation (HCC)    Sleep apnea 2016   bipap   Past Surgical History:  Procedure Laterality Date   ANTERIOR CRUCIATE LIGAMENT REPAIR     BILATERAL   ATRIAL FIBRILLATION ABLATION  06/2017   PVI at [facility] [place]   CARDIOVERSION N/A 07/08/2013   Procedure: CARDIOVERSION;  Surgeon: Lars Masson, MD;  Location: [facility] ENDOSCOPY;  Service: Cardiovascular;  Laterality: N/A;   CHOLECYSTECTOMY  2010   implantable loop recorder  06/01/2017   MDT Reveal LINQ implanted by Dr Marnette Burgess in [facility]   implantable loop recorder placement  07/14/2021   Medtronic Reveal [place] model [place] ([place] [id number]) implanted with prior ILR removed    TOTAL KNEE ARTHROPLASTY  2010   Social History   Socioeconomic History   Marital status: Married    Spouse name: Not on file   Number of children: Not on file   Years of education: Not on file   Highest education level: Not on file  Occupational History   Not on file  Tobacco Use   Smoking status: Former    Current packs/day: 0.00    Average packs/day: 0.5 packs/day for 10.0 years (5.0 ttl pk-yrs)    Types: Cigarettes    Start date: 87    Quit date: 1999    Years since quitting: 25.9   Smokeless tobacco: Never  Vaping Use   Vaping status: Never Used  Substance and Sexual Activity   Alcohol use: Yes    Alcohol/week: 1.0 standard drink of alcohol    Types: 1 Standard drinks or equivalent per week   Drug use: No   Sexual activity: Yes  Other Topics Concern   Not on file  Social History Narrative   Not on file   Social Determinants of Health   Financial Resource Strain: Low Risk  (12/06/2022)   Overall Financial Resource Strain (CARDIA)    Difficulty of Paying Living Expenses: Not hard at all  Food Insecurity: No Food Insecurity (12/06/2022)   Hunger Vital Sign  Worried About Programme researcher, broadcasting/film/video in the Last Year: Never true    Ran Out of Food in the Last Year: Never true  Transportation Needs: No Transportation Needs (12/06/2022)   PRAPARE - Administrator, Civil Service (Medical): No    Lack of Transportation (Non-Medical): No  Physical Activity: Sufficiently Active (12/06/2022)   Exercise Vital Sign    Days of Exercise per Week: 3 days    Minutes of Exercise per Session: 60 min  Stress: No Stress Concern Present (12/06/2022)   [employer] of Occupational Health - Occupational Stress Questionnaire    Feeling of Stress : Not at all  Social Connections: Moderately Integrated (12/06/2022)   Social Connection and Isolation Panel [NHANES]    Frequency of Communication with Friends and Family: More than three times a week    Frequency of Social  Gatherings with Friends and Family: More than three times a week    Attends Religious Services: Never    Database administrator or Organizations: Yes    Attends Engineer, structural: 1 to 4 times per year    Marital Status: Married   No Known Allergies Family History  Problem Relation Age of Onset   Arrhythmia Mother        atrial fibrillation   Prostate cancer Father    Colon cancer Neg Hx    Colon polyps Neg Hx    Esophageal cancer Neg Hx    Rectal cancer Neg Hx    Stomach cancer Neg Hx       Current Outpatient Medications (Respiratory):    albuterol (VENTOLIN HFA) 108 (90 Base) MCG/ACT inhaler,    cetirizine (ZYRTEC) 10 MG tablet, Take 10 mg by mouth daily as needed for allergies or rhinitis.   fluticasone (FLONASE) 50 MCG/ACT nasal spray, Place 2 sprays into both nostrils daily as needed for allergies or rhinitis.   montelukast (SINGULAIR) 10 MG tablet, Take 1 tablet (10 mg total) by mouth at bedtime.  Current Outpatient Medications (Analgesics):    aspirin EC 81 MG tablet, Take 1 tablet (81 mg total) by mouth daily.   Current Outpatient Medications (Other):    Multiple Vitamin (MULTIVITAMIN ADULT PO), Take by mouth.   olopatadine (PATANOL) 0.1 % ophthalmic solution, Place 1 drop into both eyes 2 (two) times daily.   omeprazole (PRILOSEC) 40 MG capsule, TAKE 1 CAPSULE BY MOUTH EVERY DAY   Semaglutide-Weight Management (WEGOVY) 1.7 MG/0.75ML SOAJ, INJECT 1.7 MG INTO THE SKIN ONCE A WEEK.   Vitamin D, Ergocalciferol, (DRISDOL) 1.25 MG (50000 UNIT) CAPS capsule, Take 1 capsule (50,000 Units total) by mouth every 7 (seven) days.   Reviewed prior external information including notes and imaging from  primary care provider As well as notes that were available from care everywhere and other healthcare systems.  Past medical history, social, surgical and family history all reviewed in electronic medical record.  No pertanent information unless stated regarding to the  chief complaint.   Review of Systems:  No headache, visual changes, nausea, vomiting, diarrhea, constipation, dizziness, abdominal pain, skin rash, fevers, chills, night sweats, weight loss, swollen lymph nodes, body aches, joint swelling, chest pain, shortness of breath, mood changes. POSITIVE muscle aches  Objective  Blood pressure 108/70, pulse 78, height 5\' 10"  (1.778 m), weight 169 lb (76.7 kg), SpO2 98%.   General: No apparent distress alert and oriented x3 mood and affect normal, dressed appropriately.  HEENT: Pupils equal, extraocular movements intact  Respiratory: Patient's speak in  full sentences and does not appear short of breath  Cardiovascular: No lower extremity edema, non tender, no erythema  Knee exam shows patient does have arthritic changes noted of the right knee.  Left knee is a replacement with trace effusion still noted.  Right knee still has some instability noted as well but nothing severe.  Limited muscular skeletal ultrasound was performed and interpreted by Antoine Primas, M  Limited ultrasound of patient's right knee shows no significant hypoechoic changes or swelling noted.  Still has some narrowing especially at the medial joint line.  Does seem to be improved. Contralateral knee still has some effusion noted of the replacement in the patellofemoral area. Impression: Interval improvement of the right knee     Impression and Recommendations:    The above documentation has been reviewed and is accurate and complete Judi Saa, DO

## 2023-03-13 ENCOUNTER — Ambulatory Visit: Payer: BC Managed Care – PPO | Admitting: Family Medicine

## 2023-03-13 ENCOUNTER — Encounter: Payer: Self-pay | Admitting: Family Medicine

## 2023-03-13 ENCOUNTER — Other Ambulatory Visit: Payer: Self-pay

## 2023-03-13 VITALS — BP 108/70 | HR 78 | Ht 70.0 in | Wt 169.0 lb

## 2023-03-13 DIAGNOSIS — M25562 Pain in left knee: Secondary | ICD-10-CM | POA: Diagnosis not present

## 2023-03-13 DIAGNOSIS — G8929 Other chronic pain: Secondary | ICD-10-CM | POA: Diagnosis not present

## 2023-03-13 DIAGNOSIS — M25561 Pain in right knee: Secondary | ICD-10-CM | POA: Diagnosis not present

## 2023-03-13 DIAGNOSIS — T8484XD Pain due to internal orthopedic prosthetic devices, implants and grafts, subsequent encounter: Secondary | ICD-10-CM

## 2023-03-13 DIAGNOSIS — M1711 Unilateral primary osteoarthritis, right knee: Secondary | ICD-10-CM

## 2023-03-13 DIAGNOSIS — Z96652 Presence of left artificial knee joint: Secondary | ICD-10-CM

## 2023-03-13 NOTE — Assessment & Plan Note (Addendum)
Discussed with patient that there is still seems to be some swelling of the patellofemoral joint of the replacement.  Consider compression and bracing, bone scan.  No loosening noted.  At this juncture I do not think anything else needs to be changed at the moment.  Follow-up with me as needed

## 2023-03-13 NOTE — Assessment & Plan Note (Signed)
Patient overall seems to be doing significantly better after the PRP injection.  Hopeful that this will be more of a long-term solution with multiple years of improvement.  We did discuss though that we can repeat injections if necessary.  Continue to be active.  Will follow-up again as needed

## 2023-03-13 NOTE — Patient Instructions (Signed)
The

## 2023-03-19 NOTE — Progress Notes (Signed)
Carelink Summary Report / Loop Recorder 

## 2023-03-26 ENCOUNTER — Ambulatory Visit: Payer: BC Managed Care – PPO

## 2023-03-26 DIAGNOSIS — I4819 Other persistent atrial fibrillation: Secondary | ICD-10-CM | POA: Diagnosis not present

## 2023-03-26 LAB — CUP PACEART REMOTE DEVICE CHECK
Date Time Interrogation Session: 20241215230827
Implantable Pulse Generator Implant Date: 20230406

## 2023-04-30 ENCOUNTER — Ambulatory Visit (INDEPENDENT_AMBULATORY_CARE_PROVIDER_SITE_OTHER): Payer: BC Managed Care – PPO

## 2023-04-30 DIAGNOSIS — I4819 Other persistent atrial fibrillation: Secondary | ICD-10-CM

## 2023-04-30 LAB — CUP PACEART REMOTE DEVICE CHECK
Date Time Interrogation Session: 20250119231234
Implantable Pulse Generator Implant Date: 20230406

## 2023-05-01 ENCOUNTER — Other Ambulatory Visit: Payer: Self-pay | Admitting: Nurse Practitioner

## 2023-05-01 NOTE — Telephone Encounter (Signed)
Last apt 12/06/22 not covered needs to be changed

## 2023-05-03 NOTE — Progress Notes (Signed)
Carelink Summary Report / Loop Recorder 

## 2023-05-05 ENCOUNTER — Encounter: Payer: Self-pay | Admitting: Cardiovascular Disease

## 2023-06-04 ENCOUNTER — Ambulatory Visit (INDEPENDENT_AMBULATORY_CARE_PROVIDER_SITE_OTHER): Payer: BC Managed Care – PPO

## 2023-06-04 DIAGNOSIS — I4819 Other persistent atrial fibrillation: Secondary | ICD-10-CM | POA: Diagnosis not present

## 2023-06-05 ENCOUNTER — Encounter: Payer: Self-pay | Admitting: Internal Medicine

## 2023-06-06 LAB — CUP PACEART REMOTE DEVICE CHECK
Date Time Interrogation Session: 20250223231117
Implantable Pulse Generator Implant Date: 20230406

## 2023-06-08 NOTE — Progress Notes (Signed)
 [facility] Summary Report / Loop Recorder

## 2023-06-12 ENCOUNTER — Encounter: Payer: Self-pay | Admitting: Cardiovascular Disease

## 2023-07-09 ENCOUNTER — Ambulatory Visit (INDEPENDENT_AMBULATORY_CARE_PROVIDER_SITE_OTHER): Payer: BC Managed Care – PPO

## 2023-07-09 DIAGNOSIS — I4819 Other persistent atrial fibrillation: Secondary | ICD-10-CM

## 2023-07-09 LAB — CUP PACEART REMOTE DEVICE CHECK
Date Time Interrogation Session: 20250330231007
Implantable Pulse Generator Implant Date: 20230406

## 2023-07-11 NOTE — Progress Notes (Signed)
 [facility] Summary Report / Loop Recorder

## 2023-07-11 NOTE — Addendum Note (Signed)
 Addended by: Geralyn Flash D on: 07/11/2023 02:03 PM   Modules accepted: Orders

## 2023-07-19 ENCOUNTER — Encounter: Payer: Self-pay | Admitting: Cardiovascular Disease

## 2023-08-13 ENCOUNTER — Telehealth: Payer: Self-pay

## 2023-08-13 ENCOUNTER — Ambulatory Visit (INDEPENDENT_AMBULATORY_CARE_PROVIDER_SITE_OTHER): Payer: BC Managed Care – PPO

## 2023-08-13 DIAGNOSIS — I4819 Other persistent atrial fibrillation: Secondary | ICD-10-CM | POA: Diagnosis not present

## 2023-08-13 LAB — CUP PACEART REMOTE DEVICE CHECK
Date Time Interrogation Session: 20250504231806
Implantable Pulse Generator Implant Date: 20230406

## 2023-08-13 NOTE — Telephone Encounter (Signed)
 Alert received from [employer]:  ILR summary report received. Battery status OK. Normal device function. No new symptom, tachy, brady, or pause episodes. 18 AF episodes. Longest x 8hrs on 08/02/23.  V-rates 73bpm-154bpm. AT/AF burden 4%.  Increase in AF burden during this monitoring period. Routing to triage per protocol.  No OAC per Epic.   Outreach made to Pt.  He has noticed per his smart watch that his maximum heart rate has been increased at times over the last month.  He wears his watch when cycling and reviews his exercise heart rates.  Some of the afib episodes correlate with that timing, other's are during sleeping hours (Pt goes to sleep at 10:30 pm).  Pt states he is asymptomatic with afib.  Pt notes he has been drinking an extra cup of caffeinated tea daily.  He will decrease his caffeine intake.  Advised would forward to Dr. Mealor for review and advisement.

## 2023-08-20 NOTE — Telephone Encounter (Signed)
 Follow up appointment with Dr. Arlester Ladd scheduled.

## 2023-08-21 ENCOUNTER — Ambulatory Visit: Payer: Self-pay | Admitting: Cardiovascular Disease

## 2023-08-24 NOTE — Progress Notes (Signed)
 [facility] Summary Report / Loop Recorder

## 2023-08-27 ENCOUNTER — Encounter: Payer: Self-pay | Admitting: Cardiovascular Disease

## 2023-08-27 ENCOUNTER — Ambulatory Visit: Attending: Cardiovascular Disease | Admitting: Cardiovascular Disease

## 2023-08-27 VITALS — BP 120/80 | HR 52 | Ht 70.0 in | Wt 183.0 lb

## 2023-08-27 DIAGNOSIS — I4819 Other persistent atrial fibrillation: Secondary | ICD-10-CM | POA: Diagnosis not present

## 2023-08-27 MED ORDER — APIXABAN 5 MG PO TABS: 5.0000 mg | ORAL_TABLET | Freq: Two times a day (BID) | ORAL | 11 refills | Status: AC

## 2023-08-27 MED ORDER — DRONEDARONE HCL 400 MG PO TABS: 400.0000 mg | ORAL_TABLET | Freq: Two times a day (BID) | ORAL | 6 refills | Status: DC

## 2023-08-27 NOTE — Patient Instructions (Addendum)
 Medication Instructions:  START Multaq  (dronedarone ) 400 mg twice daily  START Eliquis  5 mg twice daily (to start at least 4 weeks prior to ablation) - spoke with patient on phone, made aware of additional medication addition and reasoning for it  *If you need a refill on your cardiac medications before your next appointment, please call your pharmacy*  Lab Work: CBC and BMET - please have pre-procedure lab work completed on Monday, June 16. This can be done at ANY [facility] near you - no appointment required and this does not have to be fasting. If you have labs (blood work) drawn today and your tests are completely normal, you will receive your results only by: MyChart Message (if you have MyChart) OR A paper copy in the mail If you have any lab test that is abnormal or we need to change your treatment, we will call you to review the results.  Testing/Procedures: Cardiac CT - someone will contact you to schedule this  Your physician has requested that you have cardiac CT. Cardiac computed tomography (CT) is a painless test that uses an x-ray machine to take clear, detailed pictures of your heart. For further information please visit [email or web address] Please follow instruction sheet as given.   Atrial Fibrillation Ablation - scheduled on Wednesday, July 16.  We will be in contact closer to your ablation date with further instructions Your physician has recommended that you have an ablation. Catheter ablation is a medical procedure used to treat some cardiac arrhythmias (irregular heartbeats). During catheter ablation, a long, thin, flexible tube is put into a blood vessel in your groin (upper thigh), or neck. This tube is called an ablation catheter. It is then guided to your heart through the blood vessel. Radio frequency waves destroy small areas of heart tissue where abnormal heartbeats may cause an arrhythmia to start. Please see the instruction sheet given to you today.  Follow-Up: At [facility], you and your health needs are our priority.  As part of our continuing mission to provide you with exceptional heart care, our providers are all part of one team.  This team includes your primary Cardiologist (physician) and Advanced Practice Providers or APPs (Physician Assistants and Nurse Practitioners) who all work together to provide you with the care you need, when you need it.  Your next appointment:   We will schedule follow up after your ablation  Provider:   Marlane Silver, MD   Cardiac Ablation Cardiac ablation is a procedure to destroy, or ablate, a small amount of heart tissue that is causing problems. The heart has many electrical connections. Sometimes, these connections are abnormal and can cause the heart to beat very fast or irregularly. Ablating the abnormal areas can improve the heart's rhythm or return it to normal. Ablation may be done for people who: Have irregular or rapid heartbeats (arrhythmias). Have Wolff-Parkinson-White syndrome. Have taken medicines for an arrhythmia that did not work or caused side effects. Have a high-risk heartbeat that may be life-threatening. Tell a health care provider about: Any allergies you have. All medicines you are taking, including vitamins, herbs, eye drops, creams, and over-the-counter medicines. Any problems you or family members have had with anesthesia. Any bleeding problems you have. Any surgeries you have had. Any medical conditions you have. Whether you are pregnant or may be pregnant. What are the risks? Your health care provider will talk with you about risks. These may include: Infection. Bruising and bleeding. Stroke or blood clots. Damage to nearby  structures or organs. Allergic reaction to medicines or dyes. Needing a pacemaker if the heart gets damaged. A pacemaker is a device that helps the heart beat normally. Failure of the procedure. A repeat procedure may be needed. What happens before  the procedure? Medicines Ask your health care provider about: Changing or stopping your regular medicines. These include any heart rhythm medicines, diabetes medicines, or blood thinners you take. Taking medicines such as aspirin  and ibuprofen. These medicines can thin your blood. Do not take them unless your health care provider tells you to. Taking over-the-counter medicines, vitamins, herbs, and supplements. General instructions Follow instructions from your health care provider about what you may eat and drink. If you will be going home right after the procedure, plan to have a responsible adult: Take you home from the hospital or clinic. You will not be allowed to drive. Care for you for the time you are told. Ask your health care provider what steps will be taken to prevent infection. What happens during the procedure?  An IV will be inserted into one of your veins. You may be given: A sedative. This helps you relax. Anesthesia. This will: Numb certain areas of your body. An incision will be made in your neck or your groin. A needle will be inserted through the incision and into a large vein in your neck or groin. The small, thin tube (catheter) will be inserted through the needle and moved to your heart. A type of X-ray (fluoroscopy) will be used to help guide the catheter and provide images of the heart on a monitor. Dye may be injected through the catheter to help your surgeon see the area of the heart that needs treatment. Electrical currents will be sent from the catheter to destroy heart tissue in certain areas. There are three types of energy that may be used to do this: Heat (radiofrequency energy). Laser energy. Extreme cold (cryoablation). When the tissue has been destroyed, the catheter will be removed. Pressure will be held on the insertion area to prevent bleeding. A bandage (dressing) will be placed over the insertion area. The procedure may vary among health care  providers and hospitals. What happens after the procedure? Your blood pressure, heart rate and rhythm, breathing rate, and blood oxygen level will be monitored until you leave the hospital or clinic. Your insertion area will be checked for bleeding. You will need to lie still for a few hours. If your groin was used, you will need to keep your leg straight for a few hours after the catheter is removed. This information is not intended to replace advice given to you by your health care provider. Make sure you discuss any questions you have with your health care provider. Document Revised: 09/13/2021 Document Reviewed: 09/13/2021 Elsevier Patient Education  2024 [employer].
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
## 2023-09-13 ENCOUNTER — Ambulatory Visit (INDEPENDENT_AMBULATORY_CARE_PROVIDER_SITE_OTHER): Payer: Self-pay

## 2023-09-13 DIAGNOSIS — I4819 Other persistent atrial fibrillation: Secondary | ICD-10-CM | POA: Diagnosis not present

## 2023-09-13 LAB — CUP PACEART REMOTE DEVICE CHECK
Date Time Interrogation Session: 20250604231605
Implantable Pulse Generator Implant Date: 20230406

## 2023-09-14 ENCOUNTER — Ambulatory Visit: Payer: Self-pay | Admitting: Cardiovascular Disease

## 2023-09-28 ENCOUNTER — Encounter: Payer: Self-pay | Admitting: Cardiovascular Disease

## 2023-10-02 NOTE — Progress Notes (Signed)
 [facility] Summary Report / Loop Recorder

## 2023-10-03 ENCOUNTER — Ambulatory Visit: Payer: Self-pay | Admitting: Cardiovascular Disease

## 2023-10-03 ENCOUNTER — Ambulatory Visit (HOSPITAL_COMMUNITY)
Admission: RE | Admit: 2023-10-03 | Discharge: 2023-10-03 | Disposition: A | Discharge status: Home / Self Care | Source: Ambulatory Visit | Attending: Cardiovascular Disease | Admitting: Cardiovascular Disease

## 2023-10-03 DIAGNOSIS — I4891 Unspecified atrial fibrillation: Secondary | ICD-10-CM | POA: Insufficient documentation

## 2023-10-03 DIAGNOSIS — I4819 Other persistent atrial fibrillation: Secondary | ICD-10-CM

## 2023-10-03 MED ORDER — IOHEXOL 350 MG/ML SOLN
100.0000 mL | Freq: Once | INTRAVENOUS | Status: AC | PRN
  Administered 2023-10-03: 100 mL via INTRAVENOUS

## 2023-10-09 ENCOUNTER — Telehealth: Payer: Self-pay

## 2023-10-09 NOTE — Telephone Encounter (Signed)
 Spoke with patient, moved ablation on 11/15/23 at 1230 with Dr Nancey. Patient will have labs completed Tuesday, July 8. No further needs

## 2023-10-15 ENCOUNTER — Ambulatory Visit (INDEPENDENT_AMBULATORY_CARE_PROVIDER_SITE_OTHER): Payer: Self-pay

## 2023-10-15 DIAGNOSIS — I4819 Other persistent atrial fibrillation: Secondary | ICD-10-CM | POA: Diagnosis not present

## 2023-10-15 LAB — CUP PACEART REMOTE DEVICE CHECK
Date Time Interrogation Session: 20250706232002
Implantable Pulse Generator Implant Date: 20230406

## 2023-10-16 ENCOUNTER — Ambulatory Visit: Payer: Self-pay | Admitting: Cardiovascular Disease

## 2023-11-01 NOTE — Progress Notes (Signed)
 [facility] Summary Report / Loop Recorder

## 2023-11-06 ENCOUNTER — Encounter: Payer: Self-pay | Admitting: Cardiovascular Disease

## 2023-11-07 ENCOUNTER — Encounter: Payer: Self-pay | Admitting: Family Medicine

## 2023-11-09 MED ORDER — TIRZEPATIDE-WEIGHT MANAGEMENT 7.5 MG/0.5ML ~~LOC~~ SOLN
7.5000 mg | SUBCUTANEOUS | 1 refills | Status: DC
Start: 2023-11-09 — End: 2023-11-20

## 2023-11-15 ENCOUNTER — Ambulatory Visit: Payer: Self-pay

## 2023-11-15 DIAGNOSIS — I4819 Other persistent atrial fibrillation: Secondary | ICD-10-CM | POA: Diagnosis not present

## 2023-11-15 LAB — CUP PACEART REMOTE DEVICE CHECK
Date Time Interrogation Session: 20250806231623
Implantable Pulse Generator Implant Date: 20230406

## 2023-11-20 ENCOUNTER — Ambulatory Visit: Payer: Self-pay | Admitting: Cardiovascular Disease

## 2023-11-20 MED ORDER — TIRZEPATIDE-WEIGHT MANAGEMENT 2.5 MG/0.5ML ~~LOC~~ SOLN
2.5000 mg | SUBCUTANEOUS | 1 refills | Status: DC
Start: 2023-11-20 — End: 2024-02-21

## 2023-11-20 NOTE — Addendum Note (Signed)
 Addended by: [name] on: 11/20/2023 08:35 PM   Modules accepted: Orders

## 2023-11-30 ENCOUNTER — Other Ambulatory Visit: Payer: Self-pay

## 2023-11-30 ENCOUNTER — Ambulatory Visit (INDEPENDENT_AMBULATORY_CARE_PROVIDER_SITE_OTHER): Admitting: Family Medicine

## 2023-11-30 ENCOUNTER — Encounter: Payer: Self-pay | Admitting: Family Medicine

## 2023-11-30 VITALS — BP 124/80 | HR 61 | Ht 70.0 in | Wt 185.0 lb

## 2023-11-30 DIAGNOSIS — S42034A Nondisplaced fracture of lateral end of right clavicle, initial encounter for closed fracture: Secondary | ICD-10-CM | POA: Diagnosis not present

## 2023-11-30 DIAGNOSIS — M79645 Pain in left finger(s): Secondary | ICD-10-CM

## 2023-11-30 DIAGNOSIS — S4991XA Unspecified injury of right shoulder and upper arm, initial encounter: Secondary | ICD-10-CM

## 2023-11-30 DIAGNOSIS — S42033A Displaced fracture of lateral end of unspecified clavicle, initial encounter for closed fracture: Secondary | ICD-10-CM | POA: Insufficient documentation

## 2023-11-30 DIAGNOSIS — G8929 Other chronic pain: Secondary | ICD-10-CM

## 2023-11-30 DIAGNOSIS — M25571 Pain in right ankle and joints of right foot: Secondary | ICD-10-CM | POA: Diagnosis not present

## 2023-11-30 MED ORDER — VITAMIN D (ERGOCALCIFEROL) 1.25 MG (50000 UNIT) PO CAPS: 50000.0000 [IU] | ORAL_CAPSULE | ORAL | 0 refills | Status: DC
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
## 2023-11-30 NOTE — Patient Instructions (Addendum)
 Once weekly vit d K2 200mcg for 1 month Arnica for the ankle Ice thumb when needed See you again in 4-5 weeks (okay to double)

## 2023-11-30 NOTE — Assessment & Plan Note (Signed)
 Patient states been going on for couple years.  Think a lot of it is positional.  Discussed padding, ultrasound did not show anything specific.  Worsening pain can consider injection

## 2023-12-03 ENCOUNTER — Ambulatory Visit: Admitting: Family Medicine

## 2023-12-17 ENCOUNTER — Ambulatory Visit (INDEPENDENT_AMBULATORY_CARE_PROVIDER_SITE_OTHER): Payer: Self-pay

## 2023-12-17 DIAGNOSIS — I4819 Other persistent atrial fibrillation: Secondary | ICD-10-CM | POA: Diagnosis not present

## 2023-12-17 LAB — CUP PACEART REMOTE DEVICE CHECK
Date Time Interrogation Session: 20250906231705
Implantable Pulse Generator Implant Date: 20230406

## 2023-12-26 ENCOUNTER — Ambulatory Visit: Admitting: Family Medicine

## 2023-12-26 NOTE — Progress Notes (Unsigned)
 Alan Thompson [address] [place] [address] Phone: [phone] Subjective:   Alan Thompson, am serving as a scribe for Dr. Arthea Alan.  I'm seeing this patient by the request  of:  Alan Norleen BROCKS, MD  CC: Right shoulder pain follow-up  YEP:Dlagzrupcz  11/30/2023 From cycling injury, I do believe that there is some mild gravel, still healing from the abrasions.     Patient has a distal clavicle fracture.  Patient initially had the injury in June.  Unfortunately on ultrasound does not appear to be healing appropriately.  Start once weekly vitamin D , discussed K2, patient is to 3 months if still not healing will need to consider the possibility of bone stimulator.  Do feel we should get a repeat x-ray at follow-up.  Follow-up again in 4 weeks     Update 12/28/2023 [name] is a 58 y.o. male coming in with complaint of R clavicle, R ankle and L thumb pain. Pain in shoulder has improved. Clicking in joint intermittently. No pain with road riding.   Thumb pain is less.   Ankle is no longer painful.       Past Medical History:  Diagnosis Date   Allergy    RHINITIS   Arthritis    Asthma    GERD (gastroesophageal reflux disease)    H/O hiatal hernia    Hyperlipidemia    OSA (obstructive sleep apnea)    uses BiPAP   Persistent atrial fibrillation (HCC)    Sleep apnea 2016   bipap   Past Surgical History:  Procedure Laterality Date   ANTERIOR CRUCIATE LIGAMENT REPAIR     BILATERAL   ATRIAL FIBRILLATION ABLATION  06/2017   PVI at [facility] [place]   CARDIOVERSION N/A 07/08/2013   Procedure: CARDIOVERSION;  Surgeon: [name], MD;  Location: [facility] ENDOSCOPY;  Service: Cardiovascular;  Laterality: N/A;   CHOLECYSTECTOMY  2010   implantable loop recorder  06/01/2017   MDT Reveal LINQ implanted by Dr [name] in [facility]   implantable loop recorder placement  07/14/2021   Medtronic Reveal [place] model [place] ([place] [id number]) implanted with  prior ILR removed   TOTAL KNEE ARTHROPLASTY  2010   Social History   Socioeconomic History   Marital status: Married    Spouse name: Not on file   Number of children: Not on file   Years of education: Not on file   Highest education level: Not on file  Occupational History   Not on file  Tobacco Use   Smoking status: Former    Current packs/day: 0.00    Average packs/day: 0.5 packs/day for 10.0 years (5.0 ttl pk-yrs)    Types: Cigarettes    Start date: 34    Quit date: 1999    Years since quitting: 26.7   Smokeless tobacco: Never  Vaping Use   Vaping status: Never Used  Substance and Sexual Activity   Alcohol use: Yes    Alcohol/week: 1.0 standard drink of alcohol    Types: 1 Standard drinks or equivalent per week   Drug use: No   Sexual activity: Yes  Other Topics Concern   Not on file  Social History Narrative   Not on file   Social Drivers of Health   Financial Resource Strain: Low Risk  (12/06/2022)   Overall Financial Resource Strain (CARDIA)    Difficulty of Paying Living Expenses: Not hard at all  Food Insecurity: No Food Insecurity (12/06/2022)  Hunger Vital Sign    Worried About Running Out of Food in the Last Year: Never true    Ran Out of Food in the Last Year: Never true  Transportation Needs: No Transportation Needs (12/06/2022)   PRAPARE - Administrator, Civil Service (Medical): No    Lack of Transportation (Non-Medical): No  Physical Activity: Sufficiently Active (12/06/2022)   Exercise Vital Sign    Days of Exercise per Week: 3 days    Minutes of Exercise per Session: 60 min  Stress: No Stress Concern Present (12/06/2022)   [employer] of Occupational Health - Occupational Stress Questionnaire    Feeling of Stress : Not at all  Social Connections: Moderately Integrated (12/06/2022)   Social Connection and Isolation Panel    Frequency of Communication with Friends and Family: More than three times a week    Frequency of Social  Gatherings with Friends and Family: More than three times a week    Attends Religious Services: Never    Database administrator or Organizations: Yes    Attends Engineer, structural: 1 to 4 times per year    Marital Status: Married   No Known Allergies Family History  Problem Relation Age of Onset   Arrhythmia Mother        atrial fibrillation   Prostate cancer Father    Colon cancer Neg Hx    Colon polyps Neg Hx    Esophageal cancer Neg Hx    Rectal cancer Neg Hx    Stomach cancer Neg Hx      Current Outpatient Medications (Cardiovascular):    dronedarone  (MULTAQ ) 400 MG tablet, Take 1 tablet (400 mg total) by mouth 2 (two) times daily with a meal.  Current Outpatient Medications (Respiratory):    albuterol (VENTOLIN HFA) 108 (90 Base) MCG/ACT inhaler,    cetirizine (ZYRTEC) 10 MG tablet, Take 10 mg by mouth daily as needed for allergies or rhinitis.   fluticasone  (FLONASE ) 50 MCG/ACT nasal spray, Place 2 sprays into both nostrils daily as needed for allergies or rhinitis.   montelukast  (SINGULAIR ) 10 MG tablet, Take 1 tablet (10 mg total) by mouth at bedtime. (Patient taking differently: Take 10 mg by mouth as needed.)   Current Outpatient Medications (Hematological):    apixaban  (ELIQUIS ) 5 MG TABS tablet, Take 1 tablet (5 mg total) by mouth 2 (two) times daily.  Current Outpatient Medications (Other):    Multiple Vitamin (MULTIVITAMIN ADULT PO), Take by mouth.   olopatadine  (PATANOL) 0.1 % ophthalmic solution, Place 1 drop into both eyes 2 (two) times daily.   omeprazole  (PRILOSEC) 40 MG capsule, TAKE 1 CAPSULE BY MOUTH EVERY DAY   tirzepatide  (ZEPBOUND ) 2.5 MG/0.5ML injection vial, Inject 2.5 mg into the skin once a week.   Vitamin D , Ergocalciferol , (DRISDOL ) 1.25 MG (50000 UNIT) CAPS capsule, Take 1 capsule (50,000 Units total) by mouth every 7 (seven) days.   Vitamin D , Ergocalciferol , (DRISDOL ) 1.25 MG (50000 UNIT) CAPS capsule, Take 1 capsule (50,000 Units  total) by mouth every 7 (seven) days.   Reviewed prior external information including notes and imaging from  primary care provider As well as notes that were available from care everywhere and other healthcare systems.  Past medical history, social, surgical and family history all reviewed in electronic medical record.  No pertanent information unless stated regarding to the chief complaint.   Review of Systems:  No headache, visual changes, nausea, vomiting, diarrhea, constipation, dizziness, abdominal pain, skin rash, fevers,  chills, night sweats, weight loss, swollen lymph nodes, body aches, joint swelling, chest pain, shortness of breath, mood changes. POSITIVE muscle aches  Objective  Blood pressure 106/70, pulse 91, height 5' 10 (1.778 m), weight 185 lb (83.9 kg), SpO2 96%.   General: No apparent distress alert and oriented x3 mood and affect normal, dressed appropriately.  HEENT: Pupils equal, extraocular movements intact  Respiratory: Patient's speak in full sentences and does not appear short of breath  Cardiovascular: No lower extremity edema, non tender, no erythema  Right shoulder does have significant improvement in range of motion noted.  Patient still tender to palpation over the distal clavicle.  No crepitus still noted in the area.  Strength has improved.  Patient does have of the abrasions on the shoulder well-healing at this time   Limited muscular skeletal ultrasound was performed and interpreted by Alan Thompson, M  Limited ultrasound of patient's right shoulder still shows that there is a cortical irregularity noted at the distal clavicle but there is some soft tissue no significant moderate hard t calcific change over it is consistent with interval healing of the fracture.  Does not spend the whole width of the fracture at this time. Impression: Interval improvement of the distal clavicle fracture   Impression and Recommendations:    The above documentation has  been reviewed and is accurate and complete Alan Noguera M Audley Hinojos, DO

## 2023-12-27 ENCOUNTER — Ambulatory Visit: Payer: Self-pay | Admitting: Cardiovascular Disease

## 2023-12-27 NOTE — Progress Notes (Signed)
 Remote Loop Recorder Transmission

## 2023-12-28 ENCOUNTER — Encounter: Payer: Self-pay | Admitting: Family Medicine

## 2023-12-28 ENCOUNTER — Ambulatory Visit (INDEPENDENT_AMBULATORY_CARE_PROVIDER_SITE_OTHER): Admitting: Family Medicine

## 2023-12-28 ENCOUNTER — Other Ambulatory Visit: Payer: Self-pay

## 2023-12-28 VITALS — BP 106/70 | HR 91 | Ht 70.0 in | Wt 185.0 lb

## 2023-12-28 DIAGNOSIS — M25511 Pain in right shoulder: Secondary | ICD-10-CM | POA: Diagnosis not present

## 2023-12-28 DIAGNOSIS — S42034A Nondisplaced fracture of lateral end of right clavicle, initial encounter for closed fracture: Secondary | ICD-10-CM

## 2023-12-28 NOTE — Patient Instructions (Signed)
 Give shoulder 2 weeks Continue Vit D See me in 2 months

## 2023-12-28 NOTE — Assessment & Plan Note (Signed)
 Interval improvement noted.  Does have relatively good callus formation that is 85% healed at this time.  Range of motion exercises and start progressing with some strength.  Expect patient to be nearly pain-free within the next 6 weeks.  Follow-up again at that time to further evaluate and hopefully release patient fully.

## 2024-01-05 NOTE — Progress Notes (Signed)
 Remote Loop Recorder Transmission

## 2024-01-17 ENCOUNTER — Ambulatory Visit: Payer: Self-pay

## 2024-01-17 DIAGNOSIS — I4819 Other persistent atrial fibrillation: Secondary | ICD-10-CM

## 2024-01-18 LAB — CUP PACEART REMOTE DEVICE CHECK
Date Time Interrogation Session: 20251008231301
Implantable Pulse Generator Implant Date: 20230406

## 2024-01-21 NOTE — Progress Notes (Signed)
 Remote Loop Recorder Transmission

## 2024-01-24 ENCOUNTER — Ambulatory Visit: Payer: Self-pay | Admitting: Cardiovascular Disease

## 2024-02-01 ENCOUNTER — Encounter: Payer: Self-pay | Admitting: Cardiovascular Disease

## 2024-02-01 ENCOUNTER — Ambulatory Visit: Attending: Cardiovascular Disease | Admitting: Cardiovascular Disease

## 2024-02-01 VITALS — BP 110/78 | HR 54 | Ht 70.0 in | Wt 183.0 lb

## 2024-02-01 DIAGNOSIS — I4819 Other persistent atrial fibrillation: Secondary | ICD-10-CM | POA: Diagnosis not present

## 2024-02-01 DIAGNOSIS — Z01812 Encounter for preprocedural laboratory examination: Secondary | ICD-10-CM

## 2024-02-01 NOTE — H&P (View-Only) (Signed)
 Electrophysiology Office Note:    Date:  02/01/2024   ID:  Alan Thompson, DOB 1965/08/07, MRN [id number]  PCP:  [name], MD    HeartCare Providers Cardiologist:  None Electrophysiologist:  Eulas FORBES Furbish, MD  Sleep Medicine:  Wilbert Bihari, MD     Referring MD: [name], MD   History of Present Illness:    Alan Thompson is a 58 y.o. male with a medical history significant for persistent atrial fibrillation, obstructive sleep apnea on BiPAP, referred for atrial fibrillation management.     He has a loop recorder implanted by Dr. Kelsie that has been used for A-fib monitoring.  He was last seen in clinic in 2023.  This showed an increase in A-fib burden over time, most recently 4% burden.  The patient has a smart watch and has noticed an increase in his heart rates from time to time.  During atrial fibrillation, his ventricular rates are usually well-controlled, but sometimes are not.  Discussed the use of AI scribe software for clinical note transcription with the patient, who gave verbal consent to proceed.  History of Present Illness Alan Thompson is a 58 year old male with atrial fibrillation who presents with increased frequency of AFib episodes.  He has a history of atrial fibrillation and is currently monitored with a loop recorder. Recently, there has been an increase in the frequency of AFib episodes, including a recent episode where his heart rate exceeded 200 bpm during a bike ride, although he did not experience any symptoms at the time. His usual heart rate during such activities ranges from 120 to 160 bpm.  He underwent a radiofrequency ablation procedure in 2018 or 2019, which was effective until recently. The ablation was performed in [place]  before the COVID-19 pandemic. He has been informed through MyChart of an eight and a half hour episode of AFib and several shorter episodes, which concerns him as his previous treatment approach in [place] was to eliminate any AFib occurrences.  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 He has a history of multiple cardioversions prior to his ablation. He reports no discomfort following his previous ablation and wants to maintain control over his AFib without the use of medication if possible.         Today, He reports that he is doing well.  He has been tolerating the dronedarone  and has not had any A-fib events on it since May.  EKGs/Labs/Other Studies Reviewed Today:     Echocardiogram:  TTE March 2015 Normal ejection fraction, 60 to 65%.  Normal structure and function.  Left atrium mildly dilated.   EKG:   EKG Interpretation Date/Time:  Friday February 01 2024 10:42:25 EDT Ventricular Rate:  54 PR Interval:  162 QRS Duration:  84 QT Interval:  416 QTC Calculation: 394 R Axis:   83  Text Interpretation: Sinus bradycardia When compared with ECG of 27-Aug-2023 11:50, No significant change was found Confirmed by Furbish Eulas [phone]) on 02/01/2024 10:51:45 AM     Physical Exam:    VS:  BP 110/78 (BP Location: Left Arm, Patient Position: Sitting, Cuff Size: Large)   Pulse (!) 54   Ht 5' 10 (1.778 m)   Wt 183 lb (83 kg)   SpO2 99%   BMI 26.26 kg/m     Wt Readings from Last 3 Encounters:  02/01/24 183 lb (83 kg)  12/28/23 185 lb (83.9 kg)  11/30/23 185  lb (83.9 kg)     GEN:  Well nourished, well developed in no acute distress CARDIAC: iRRR, no murmurs, rubs, gallops RESPIRATORY:  Normal work of breathing MUSCULOSKELETAL: no edema    ASSESSMENT & PLAN:     Persistent atrial fibrillation History of radiofrequency ablation of atrial fibrillation in [place]  in 2018 or 2019 Monitored with ILR  AF burden has been increasing, 4%, episodes lasting longer (over 8 hours) Rates often not well-controlled He is scheduled for A-fib ablation in a few weeks. He has been maintaining sinus rhythm on Multaq   We discussed the indication,  rationale, logistics, anticipated benefits, and potential risks of the ablation procedure including but not limited to -- bleed at the groin access site, chest pain, damage to nearby organs such as the diaphragm, lungs, or esophagus, need for a drainage tube, or prolonged hospitalization. I explained that the risk for stroke, heart attack, need for open chest surgery, or even death is very low but not zero. he  expressed understanding and wishes to proceed.   Medtronic loop recorder in place His second device Normal function  Obstructive sleep apnea Uses BiPAP  Secondary hypercoagulable state CHA2DS2-VASc score is 0 He will continue apixaban  5 mg twice daily in the periprocedural period (discontinue 3 months after the ablation)   Signed, Eulas FORBES Furbish, MD  02/01/2024 11:06 AM    [facility]

## 2024-02-01 NOTE — Patient Instructions (Signed)
 Medication Instructions:  None ordered.  *If you need a refill on your cardiac medications before your next appointment, please call your pharmacy*  Lab Work: CBC and BMET today  If you have labs (blood work) drawn today and your tests are completely normal, you will receive your results only by: MyChart Message (if you have MyChart) OR A paper copy in the mail If you have any lab test that is abnormal or we need to change your treatment, we will call you to review the results.  Testing/Procedures: None ordered.   Follow-Up: At [facility], you and your health needs are our priority.  As part of our continuing mission to provide you with exceptional heart care, our providers are all part of one team.  This team includes your primary Cardiologist (physician) and Advanced Practice Providers or APPs (Physician Assistants and Nurse Practitioners) who all work together to provide you with the care you need, when you need it.  Your next appointment:   As scheduled

## 2024-02-01 NOTE — Progress Notes (Signed)
 Electrophysiology Office Note:    Date:  02/01/2024   ID:  TOLLIE CANADA, DOB 1965/08/07, MRN [id number]  PCP:  [name], MD    HeartCare Providers Cardiologist:  None Electrophysiologist:  Eulas FORBES Furbish, MD  Sleep Medicine:  Wilbert Bihari, MD     Referring MD: [name], MD   History of Present Illness:    AMY BELLOSO is a 58 y.o. male with a medical history significant for persistent atrial fibrillation, obstructive sleep apnea on BiPAP, referred for atrial fibrillation management.     He has a loop recorder implanted by Dr. Kelsie that has been used for A-fib monitoring.  He was last seen in clinic in 2023.  This showed an increase in A-fib burden over time, most recently 4% burden.  The patient has a smart watch and has noticed an increase in his heart rates from time to time.  During atrial fibrillation, his ventricular rates are usually well-controlled, but sometimes are not.  Discussed the use of AI scribe software for clinical note transcription with the patient, who gave verbal consent to proceed.  History of Present Illness NIKKI RUSNAK is a 58 year old male with atrial fibrillation who presents with increased frequency of AFib episodes.  He has a history of atrial fibrillation and is currently monitored with a loop recorder. Recently, there has been an increase in the frequency of AFib episodes, including a recent episode where his heart rate exceeded 200 bpm during a bike ride, although he did not experience any symptoms at the time. His usual heart rate during such activities ranges from 120 to 160 bpm.  He underwent a radiofrequency ablation procedure in 2018 or 2019, which was effective until recently. The ablation was performed in [place]  before the COVID-19 pandemic. He has been informed through MyChart of an eight and a half hour episode of AFib and several shorter episodes, which concerns him as his previous treatment approach in [place] was to eliminate any AFib occurrences.  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 He has a history of multiple cardioversions prior to his ablation. He reports no discomfort following his previous ablation and wants to maintain control over his AFib without the use of medication if possible.         Today, He reports that he is doing well.  He has been tolerating the dronedarone  and has not had any A-fib events on it since May.  EKGs/Labs/Other Studies Reviewed Today:     Echocardiogram:  TTE March 2015 Normal ejection fraction, 60 to 65%.  Normal structure and function.  Left atrium mildly dilated.   EKG:   EKG Interpretation Date/Time:  Friday February 01 2024 10:42:25 EDT Ventricular Rate:  54 PR Interval:  162 QRS Duration:  84 QT Interval:  416 QTC Calculation: 394 R Axis:   83  Text Interpretation: Sinus bradycardia When compared with ECG of 27-Aug-2023 11:50, No significant change was found Confirmed by Furbish Eulas [phone]) on 02/01/2024 10:51:45 AM     Physical Exam:    VS:  BP 110/78 (BP Location: Left Arm, Patient Position: Sitting, Cuff Size: Large)   Pulse (!) 54   Ht 5' 10 (1.778 m)   Wt 183 lb (83 kg)   SpO2 99%   BMI 26.26 kg/m     Wt Readings from Last 3 Encounters:  02/01/24 183 lb (83 kg)  12/28/23 185 lb (83.9 kg)  11/30/23 185  lb (83.9 kg)     GEN:  Well nourished, well developed in no acute distress CARDIAC: iRRR, no murmurs, rubs, gallops RESPIRATORY:  Normal work of breathing MUSCULOSKELETAL: no edema    ASSESSMENT & PLAN:     Persistent atrial fibrillation History of radiofrequency ablation of atrial fibrillation in [place]  in 2018 or 2019 Monitored with ILR  AF burden has been increasing, 4%, episodes lasting longer (over 8 hours) Rates often not well-controlled He is scheduled for A-fib ablation in a few weeks. He has been maintaining sinus rhythm on Multaq   We discussed the indication,  rationale, logistics, anticipated benefits, and potential risks of the ablation procedure including but not limited to -- bleed at the groin access site, chest pain, damage to nearby organs such as the diaphragm, lungs, or esophagus, need for a drainage tube, or prolonged hospitalization. I explained that the risk for stroke, heart attack, need for open chest surgery, or even death is very low but not zero. he  expressed understanding and wishes to proceed.   Medtronic loop recorder in place His second device Normal function  Obstructive sleep apnea Uses BiPAP  Secondary hypercoagulable state CHA2DS2-VASc score is 0 He will continue apixaban  5 mg twice daily in the periprocedural period (discontinue 3 months after the ablation)   Signed, Eulas FORBES Furbish, MD  02/01/2024 11:06 AM    [facility]

## 2024-02-05 ENCOUNTER — Encounter (HOSPITAL_COMMUNITY): Payer: Self-pay

## 2024-02-16 LAB — CBC
Hematocrit: 43.6 % (ref 37.5–51.0)
Hemoglobin: 14.1 g/dL (ref 13.0–17.7)
MCH: 31.1 pg (ref 26.6–33.0)
MCHC: 32.3 g/dL (ref 31.5–35.7)
MCV: 96 fL (ref 79–97)
Platelets: 206 x10E3/uL (ref 150–450)
RBC: 4.53 x10E6/uL (ref 4.14–5.80)
RDW: 11.4 % — ABNORMAL LOW (ref 11.6–15.4)
WBC: 4.9 x10E3/uL (ref 3.4–10.8)

## 2024-02-16 LAB — BASIC METABOLIC PANEL WITH GFR
BUN/Creatinine Ratio: 14 (ref 9–20)
BUN: 18 mg/dL (ref 6–24)
CO2: 24 mmol/L (ref 20–29)
Calcium: 9.1 mg/dL (ref 8.7–10.2)
Chloride: 105 mmol/L (ref 96–106)
Creatinine, Ser: 1.3 mg/dL — ABNORMAL HIGH (ref 0.76–1.27)
Glucose: 78 mg/dL (ref 70–99)
Potassium: 4.9 mmol/L (ref 3.5–5.2)
Sodium: 141 mmol/L (ref 134–144)
eGFR: 64 mL/min/1.73 (ref >59)

## 2024-02-17 ENCOUNTER — Ambulatory Visit (INDEPENDENT_AMBULATORY_CARE_PROVIDER_SITE_OTHER): Payer: Self-pay

## 2024-02-17 DIAGNOSIS — I4819 Other persistent atrial fibrillation: Secondary | ICD-10-CM

## 2024-02-18 LAB — CUP PACEART REMOTE DEVICE CHECK
Date Time Interrogation Session: 20251108230950
Implantable Pulse Generator Implant Date: 20230406

## 2024-02-20 ENCOUNTER — Ambulatory Visit: Payer: Self-pay | Admitting: Cardiovascular Disease

## 2024-02-20 NOTE — Progress Notes (Unsigned)
 [name] [address] [place] [address] Phone: [phone] Subjective:   [name], am serving as a scribe for Dr. Arthea Claudene.  I'm seeing this patient by the request  of:  [name], MD  CC: right shoulder pain f/u  YEP:Dlagzrupcz  12/28/2023 Interval improvement noted.  Does have relatively good callus formation that is 85% healed at this time.  Range of motion exercises and start progressing with some strength.  Expect patient to be nearly pain-free within the next 6 weeks.  Follow-up again at that time to further evaluate and hopefully release patient fully.      Update 02/22/2024 Alan Thompson is a 58 y.o. male coming in with complaint of R shoulder pain. Patient states that he has some soreness at times but overall not having any pain. Notices clicking.       Past Medical History:  Diagnosis Date   Allergy    RHINITIS   Arthritis    Asthma    GERD (gastroesophageal reflux disease)    H/O hiatal hernia    Hyperlipidemia    OSA (obstructive sleep apnea)    uses BiPAP   Persistent atrial fibrillation (HCC)    Sleep apnea 2016   bipap   Past Surgical History:  Procedure Laterality Date   ANTERIOR CRUCIATE LIGAMENT REPAIR     BILATERAL   ATRIAL FIBRILLATION ABLATION  06/2017   PVI at [facility] [place]   CARDIOVERSION N/A 07/08/2013   Procedure: CARDIOVERSION;  Surgeon: [name], MD;  Location: [facility] ENDOSCOPY;  Service: Cardiovascular;  Laterality: N/A;   CHOLECYSTECTOMY  2010   implantable loop recorder  06/01/2017   MDT Reveal LINQ implanted by Dr [name] in [facility]   implantable loop recorder placement  07/14/2021   Medtronic Reveal [place] model [place] ([place] [id number]) implanted with prior ILR removed   TOTAL KNEE ARTHROPLASTY  2010   Social History   Socioeconomic History   Marital status: Married    Spouse name: Not on file   Number of children: Not on file   Years of education: Not on file   Highest  education level: Not on file  Occupational History   Not on file  Tobacco Use   Smoking status: Former    Current packs/day: 0.00    Average packs/day: 0.5 packs/day for 10.0 years (5.0 ttl pk-yrs)    Types: Cigarettes    Start date: 64    Quit date: 1999    Years since quitting: 26.8   Smokeless tobacco: Never  Vaping Use   Vaping status: Never Used  Substance and Sexual Activity   Alcohol use: Yes    Alcohol/week: 1.0 standard drink of alcohol    Types: 1 Standard drinks or equivalent per week   Drug use: No   Sexual activity: Yes  Other Topics Concern   Not on file  Social History Narrative   Not on file   Social Drivers of Health   Financial Resource Strain: Low Risk  (12/06/2022)   Overall Financial Resource Strain (CARDIA)    Difficulty of Paying Living Expenses: Not hard at all  Food Insecurity: No Food Insecurity (12/06/2022)   Hunger Vital Sign    Worried About Running Out of Food in the Last Year: Never true    Ran Out of Food in the Last Year: Never true  Transportation Needs: No Transportation Needs (12/06/2022)   PRAPARE - Transportation    Lack of Transportation (  Medical): No    Lack of Transportation (Non-Medical): No  Physical Activity: Sufficiently Active (12/06/2022)   Exercise Vital Sign    Days of Exercise per Week: 3 days    Minutes of Exercise per Session: 60 min  Stress: No Stress Concern Present (12/06/2022)   [employer] of Occupational Health - Occupational Stress Questionnaire    Feeling of Stress : Not at all  Social Connections: Moderately Integrated (12/06/2022)   Social Connection and Isolation Panel    Frequency of Communication with Friends and Family: More than three times a week    Frequency of Social Gatherings with Friends and Family: More than three times a week    Attends Religious Services: Never    Database Administrator or Organizations: Yes    Attends Engineer, Structural: 1 to 4 times per year    Marital  Status: Married   No Known Allergies Family History  Problem Relation Age of Onset   Arrhythmia Mother        atrial fibrillation   Prostate cancer Father    Colon cancer Neg Hx    Colon polyps Neg Hx    Esophageal cancer Neg Hx    Rectal cancer Neg Hx    Stomach cancer Neg Hx      Current Outpatient Medications (Cardiovascular):    dronedarone  (MULTAQ ) 400 MG tablet, Take 1 tablet (400 mg total) by mouth 2 (two) times daily with a meal.  Current Outpatient Medications (Respiratory):    cetirizine (ZYRTEC) 10 MG tablet, Take 10 mg by mouth daily.   Current Outpatient Medications (Hematological):    apixaban  (ELIQUIS ) 5 MG TABS tablet, Take 1 tablet (5 mg total) by mouth 2 (two) times daily.  Current Outpatient Medications (Other):    MAGNESIUM GLYCINATE PO, Take 200 mg by mouth daily.   Menaquinone-7 (VITAMIN K2 PO), Take 200 mcg by mouth daily.   Multiple Vitamin (MULTIVITAMIN ADULT PO), Take 1 tablet by mouth daily. Centrum men   omeprazole  (PRILOSEC) 40 MG capsule, TAKE 1 CAPSULE BY MOUTH EVERY DAY (Patient taking differently: Take 20 mg by mouth daily.)   Probiotic Product (PROBIOTIC PO), Take 1 capsule by mouth daily.   tirzepatide  (ZEPBOUND ) 2.5 MG/0.5ML injection vial, INJECT 0.5 ML (2.5 MG) UNDER THE SKIN ONCE WEEKLY (0.5ML= 50 UNITS)   Vitamin D , Ergocalciferol , (DRISDOL ) 1.25 MG (50000 UNIT) CAPS capsule, Take 1 capsule (50,000 Units total) by mouth every 7 (seven) days.   Reviewed prior external information including notes and imaging from  primary care provider As well as notes that were available from care everywhere and other healthcare systems.  Past medical history, social, surgical and family history all reviewed in electronic medical record.  No pertanent information unless stated regarding to the chief complaint.   Review of Systems:  No headache, visual changes, nausea, vomiting, diarrhea, constipation, dizziness, abdominal pain, skin rash, fevers,  chills, night sweats, weight loss, swollen lymph nodes, body aches, joint swelling, chest pain, shortness of breath, mood changes. POSITIVE muscle aches  Objective  Blood pressure 100/60, pulse (!) 59, height 5' 10 (1.778 m), weight 193 lb (87.5 kg), SpO2 97%.   General: No apparent distress alert and oriented x3 mood and affect normal, dressed appropriately.  HEENT: Pupils equal, extraocular movements intact  Respiratory: Patient's speak in full sentences and does not appear short of breath  Cardiovascular: No lower extremity edema, non tender, no erythema  Right shoulder exam shows significant improvement in range of motion.  No  significant impingement noted.  Negative crossover noted.  Limited muscular skeletal ultrasound was performed and interpreted by CLAUDENE HUSSAR, M  Limited ultrasound does show that there is still a cortical irregularity noted of the distal clavicle.  Seems to have no more bone turnover since previous exam. 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

## 2024-02-21 ENCOUNTER — Other Ambulatory Visit: Payer: Self-pay | Admitting: Family Medicine

## 2024-02-21 NOTE — Progress Notes (Signed)
 Remote Loop Recorder Transmission

## 2024-02-22 ENCOUNTER — Other Ambulatory Visit: Payer: Self-pay

## 2024-02-22 ENCOUNTER — Ambulatory Visit: Admitting: Family Medicine

## 2024-02-22 ENCOUNTER — Encounter: Payer: Self-pay | Admitting: Family Medicine

## 2024-02-22 VITALS — BP 100/60 | HR 59 | Ht 70.0 in | Wt 193.0 lb

## 2024-02-22 DIAGNOSIS — S42034G Nondisplaced fracture of lateral end of right clavicle, subsequent encounter for fracture with delayed healing: Secondary | ICD-10-CM | POA: Diagnosis not present

## 2024-02-22 DIAGNOSIS — M25511 Pain in right shoulder: Secondary | ICD-10-CM

## 2024-02-22 NOTE — Assessment & Plan Note (Signed)
 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  We discussed icing regimen and home exercises, discussed which activities to do and which ones to avoid.  Increase activity slowly.  Patient is 100% asymptomatic at this time.  Follow-up with me as no need for any more vitamin D  supplementation.

## 2024-02-25 MED ORDER — SODIUM CHLORIDE 0.9 % IV SOLN: INTRAVENOUS | Status: DC

## 2024-02-25 NOTE — Pre-Procedure Instructions (Signed)
 Instructed patient on the following items: Arrival time 0515 Nothing to eat or drink after midnight No meds AM of procedure Responsible person to drive you home and stay with you for 24 hrs  Have you missed any doses of anti-coagulant Eliquis- takes twice a day, hasn't missed any doses in last 4 weeks.  Don't take dose morning of procedure.

## 2024-02-26 ENCOUNTER — Other Ambulatory Visit: Payer: Self-pay

## 2024-02-26 ENCOUNTER — Ambulatory Visit (HOSPITAL_COMMUNITY): Admitting: Anesthesiology

## 2024-02-26 ENCOUNTER — Ambulatory Visit (HOSPITAL_COMMUNITY)
Admission: RE | Disposition: A | Discharge status: Home / Self Care | Payer: Self-pay | Source: Home / Self Care | Attending: Cardiovascular Disease

## 2024-02-26 ENCOUNTER — Ambulatory Visit (HOSPITAL_COMMUNITY)
Admission: RE | Admit: 2024-02-26 | Discharge: 2024-02-26 | Disposition: A | Attending: Cardiovascular Disease | Admitting: Cardiovascular Disease

## 2024-02-26 DIAGNOSIS — Z95818 Presence of other cardiac implants and grafts: Secondary | ICD-10-CM | POA: Diagnosis not present

## 2024-02-26 DIAGNOSIS — Z79899 Other long term (current) drug therapy: Secondary | ICD-10-CM | POA: Diagnosis not present

## 2024-02-26 DIAGNOSIS — G4733 Obstructive sleep apnea (adult) (pediatric): Secondary | ICD-10-CM | POA: Insufficient documentation

## 2024-02-26 DIAGNOSIS — Z7901 Long term (current) use of anticoagulants: Secondary | ICD-10-CM | POA: Insufficient documentation

## 2024-02-26 DIAGNOSIS — I480 Paroxysmal atrial fibrillation: Secondary | ICD-10-CM

## 2024-02-26 DIAGNOSIS — I4819 Other persistent atrial fibrillation: Secondary | ICD-10-CM | POA: Insufficient documentation

## 2024-02-26 DIAGNOSIS — D6869 Other thrombophilia: Secondary | ICD-10-CM | POA: Diagnosis not present

## 2024-02-26 DIAGNOSIS — G4730 Sleep apnea, unspecified: Secondary | ICD-10-CM | POA: Diagnosis not present

## 2024-02-26 DIAGNOSIS — Z87891 Personal history of nicotine dependence: Secondary | ICD-10-CM | POA: Diagnosis not present

## 2024-02-26 DIAGNOSIS — J45909 Unspecified asthma, uncomplicated: Secondary | ICD-10-CM

## 2024-02-26 HISTORY — PX: ATRIAL FIBRILLATION ABLATION: EP1191

## 2024-02-26 LAB — POCT ACTIVATED CLOTTING TIME: Activated Clotting Time: 268 s

## 2024-02-26 SURGERY — ATRIAL FIBRILLATION ABLATION
Anesthesia: General

## 2024-02-26 MED ORDER — ACETAMINOPHEN 325 MG PO TABS: 650.0000 mg | ORAL_TABLET | ORAL | Status: DC | PRN

## 2024-02-26 MED ORDER — DEXAMETHASONE SOD PHOSPHATE PF 10 MG/ML IJ SOLN
INTRAMUSCULAR | Status: DC | PRN
  Administered 2024-02-26: 10 mg via INTRAVENOUS

## 2024-02-26 MED ORDER — SUGAMMADEX SODIUM 200 MG/2ML IV SOLN
INTRAVENOUS | Status: DC | PRN
  Administered 2024-02-26: 200 mg via INTRAVENOUS

## 2024-02-26 MED ORDER — SODIUM CHLORIDE 0.9% FLUSH: 3.0000 mL | INTRAVENOUS | Status: DC | PRN

## 2024-02-26 MED ORDER — ROCURONIUM BROMIDE 10 MG/ML (PF) SYRINGE
PREFILLED_SYRINGE | INTRAVENOUS | Status: DC | PRN
  Administered 2024-02-26: 80 mg via INTRAVENOUS

## 2024-02-26 MED ORDER — HEPARIN SODIUM (PORCINE) 1000 UNIT/ML IJ SOLN
INTRAMUSCULAR | Status: DC | PRN
  Administered 2024-02-26: 15000 [IU] via INTRAVENOUS

## 2024-02-26 MED ORDER — ATROPINE SULFATE 1 MG/10ML IJ SOSY
PREFILLED_SYRINGE | INTRAMUSCULAR | Status: AC
Start: 2024-02-26 — End: 2024-02-26
  Filled 2024-02-26: qty 10

## 2024-02-26 MED ORDER — HEPARIN SODIUM (PORCINE) 1000 UNIT/ML IJ SOLN
INTRAMUSCULAR | Status: AC
  Filled 2024-02-26: qty 1

## 2024-02-26 MED ORDER — MIDAZOLAM HCL (PF) 2 MG/2ML IJ SOLN
INTRAMUSCULAR | Status: DC | PRN
  Administered 2024-02-26: 2 mg via INTRAVENOUS

## 2024-02-26 MED ORDER — SODIUM CHLORIDE 0.9% FLUSH: 3.0000 mL | Freq: Two times a day (BID) | INTRAVENOUS | Status: DC

## 2024-02-26 MED ORDER — FENTANYL CITRATE (PF) 250 MCG/5ML IJ SOLN
INTRAMUSCULAR | Status: DC | PRN
  Administered 2024-02-26: 100 ug via INTRAVENOUS

## 2024-02-26 MED ORDER — MIDAZOLAM HCL 2 MG/2ML IJ SOLN
INTRAMUSCULAR | Status: AC
Start: 2024-02-26 — End: 2024-02-26
  Filled 2024-02-26: qty 2

## 2024-02-26 MED ORDER — SODIUM CHLORIDE 0.9 % IV SOLN: 250.0000 mL | INTRAVENOUS | Status: DC | PRN

## 2024-02-26 MED ORDER — PROTAMINE SULFATE 10 MG/ML IV SOLN
INTRAVENOUS | Status: DC | PRN
  Administered 2024-02-26: 50 mg via INTRAVENOUS

## 2024-02-26 MED ORDER — LIDOCAINE 2% (20 MG/ML) 5 ML SYRINGE
INTRAMUSCULAR | Status: DC | PRN
  Administered 2024-02-26: 80 mg via INTRAVENOUS

## 2024-02-26 MED ORDER — ATROPINE SULFATE 1 MG/ML IV SOLN
INTRAVENOUS | Status: DC | PRN
  Administered 2024-02-26: 1 mg via INTRAVENOUS

## 2024-02-26 MED ORDER — HEPARIN SODIUM (PORCINE) 1000 UNIT/ML IJ SOLN
INTRAMUSCULAR | Status: AC
  Filled 2024-02-26: qty 10

## 2024-02-26 MED ORDER — HEPARIN (PORCINE) IN NACL 1000-0.9 UT/500ML-% IV SOLN
INTRAVENOUS | Status: DC | PRN
  Administered 2024-02-26 (×3): 500 mL

## 2024-02-26 MED ORDER — PHENYLEPHRINE HCL-NACL 20-0.9 MG/250ML-% IV SOLN
INTRAVENOUS | Status: DC | PRN
Start: 2024-02-26 — End: 2024-02-26
  Administered 2024-02-26: 20 ug/min via INTRAVENOUS

## 2024-02-26 MED ORDER — ONDANSETRON HCL 4 MG/2ML IJ SOLN: 4.0000 mg | Freq: Four times a day (QID) | INTRAMUSCULAR | Status: DC | PRN

## 2024-02-26 MED ORDER — ONDANSETRON HCL 4 MG/2ML IJ SOLN
INTRAMUSCULAR | Status: DC | PRN
  Administered 2024-02-26: 4 mg via INTRAVENOUS

## 2024-02-26 MED ORDER — PROPOFOL 10 MG/ML IV BOLUS
INTRAVENOUS | Status: DC | PRN
  Administered 2024-02-26: 160 mg via INTRAVENOUS

## 2024-02-26 MED ORDER — FENTANYL CITRATE (PF) 100 MCG/2ML IJ SOLN
INTRAMUSCULAR | Status: AC
  Filled 2024-02-26: qty 2

## 2024-02-26 MED ORDER — ATROPINE SULFATE 1 MG/10ML IJ SOSY
PREFILLED_SYRINGE | INTRAMUSCULAR | Status: AC
  Filled 2024-02-26: qty 10

## 2024-02-26 SURGICAL SUPPLY — 18 items
CABLE FARASTAR GEN2 SNGL USE (CABLE) IMPLANT
CATH FARAWAVE 2.0 31 (CATHETERS) IMPLANT
CATH GE 8FR SOUNDSTAR (CATHETERS) IMPLANT
CATH OCTARAY 2.0 F 3-3-3-3-3 (CATHETERS) IMPLANT
CATH WEBSTER BI DIR CS D-F CRV (CATHETERS) IMPLANT
CLOSURE PERCLOSE PROSTYLE (Vascular Products) IMPLANT
COVER SWIFTLINK CONNECTOR (BAG) ×1 IMPLANT
DEVICE CLOSURE MYNXGRIP 6/7F (Vascular Products) IMPLANT
DILATOR VESSEL 38 20CM 16FR (INTRODUCER) IMPLANT
GUIDEWIRE INQWIRE 1.5J.035X260 (WIRE) IMPLANT
KIT VERSACROSS CNCT FARADRIVE (KITS) IMPLANT
PACK EP LF (CUSTOM PROCEDURE TRAY) ×1 IMPLANT
PAD DEFIB RADIO PHYSIO CONN (PAD) ×1 IMPLANT
PATCH CARTO3 (PAD) IMPLANT
SHEATH FARADRIVE STEERABLE (SHEATH) IMPLANT
SHEATH PINNACLE 8F 10CM (SHEATH) IMPLANT
SHEATH PINNACLE 9F 10CM (SHEATH) IMPLANT
SHEATH PROBE COVER 6X72 (BAG) IMPLANT

## 2024-02-26 NOTE — Anesthesia Postprocedure Evaluation (Signed)
 Anesthesia Post Note  Patient: Alan Thompson  Procedure(s) Performed: ATRIAL FIBRILLATION ABLATION     Patient location during evaluation: PACU Anesthesia Type: General Level of consciousness: awake and alert Pain management: pain level controlled Vital Signs Assessment: post-procedure vital signs reviewed and stable Respiratory status: spontaneous breathing, nonlabored ventilation, respiratory function stable and patient connected to nasal cannula oxygen Cardiovascular status: blood pressure returned to baseline and stable Postop Assessment: no apparent nausea or vomiting Anesthetic complications: no   No notable events documented.  Last Vitals:  Vitals:   02/26/24 1130 02/26/24 1200  BP: 100/63 103/67  Pulse: (!) 55 (!) 55  Resp: 16 17  Temp:    SpO2: 98% 98%    Last Pain:  Vitals:   02/26/24 1009  TempSrc:   PainSc: 0-No pain                 Alan Thompson

## 2024-02-26 NOTE — Discharge Instructions (Signed)

## 2024-02-26 NOTE — Progress Notes (Signed)
 Patient ambulated and voided without complication. Discharge instruction were provided. Procedural sites looks intact. Patient is ready for discharge

## 2024-02-26 NOTE — Interval H&P Note (Signed)
 History and Physical Interval Note:  02/26/2024 7:15 AM  Alan Thompson  has presented today for surgery, with the diagnosis of afib.  The various methods of treatment have been discussed with the patient and family. After consideration of risks, benefits and other options for treatment, the patient has consented to  Procedure(s): ATRIAL FIBRILLATION ABLATION (N/A) as a surgical intervention.  The patient's history has been reviewed, patient examined, no change in status, stable for surgery.  I have reviewed the patient's chart and labs.  Questions were answered to the patient's satisfaction.     Bassem Bernasconi E [name]

## 2024-02-26 NOTE — Anesthesia Procedure Notes (Signed)
 Procedure Name: Intubation Date/Time: 02/26/2024 7:49 AM  Performed by: Genny Gun, CRNAPre-anesthesia Checklist: Patient identified, Emergency Drugs available, Suction available, Patient being monitored and Timeout performed Patient Re-evaluated:Patient Re-evaluated prior to induction Oxygen Delivery Method: Circle system utilized Preoxygenation: Pre-oxygenation with 100% oxygen Induction Type: IV induction Ventilation: Mask ventilation without difficulty and Oral airway inserted - appropriate to patient size Laryngoscope Size: Mac and 3 Grade View: Grade I Tube type: Oral Tube size: 7.5 mm Number of attempts: 1 Placement Confirmation: ETT inserted through vocal cords under direct vision, positive ETCO2 and breath sounds checked- equal and bilateral Secured at: 23 cm Tube secured with: Tape Dental Injury: Teeth and Oropharynx as per pre-operative assessment

## 2024-02-26 NOTE — Transfer of Care (Signed)
 Immediate Anesthesia Transfer of Care Note  Patient: Alan Thompson  Procedure(s) Performed: ATRIAL FIBRILLATION ABLATION  Patient Location: PACU and Cath Lab  Anesthesia Type:General  Level of Consciousness: awake, alert , and oriented  Airway & Oxygen Therapy: Patient Spontanous Breathing  Post-op Assessment: Report given to RN and Post -op Vital signs reviewed and stable  Post vital signs: Reviewed and stable  Last Vitals:  Vitals Value Taken Time  BP 109/66 02/26/24 09:15  Temp 36.8 C 02/26/24 09:13  Pulse 61 02/26/24 09:18  Resp 14 02/26/24 09:18  SpO2 96 % 02/26/24 09:18  Vitals shown include unfiled device data.  Last Pain:  Vitals:   02/26/24 0538  TempSrc: Oral  PainSc: 0-No pain         Complications: No notable events documented.

## 2024-02-26 NOTE — Anesthesia Preprocedure Evaluation (Addendum)
 Anesthesia Evaluation  Patient identified by MRN, date of birth, ID band Patient awake    Reviewed: Allergy & Precautions, NPO status , Patient's Chart, lab work & pertinent test results  Airway Mallampati: III  TM Distance: >3 FB Neck ROM: Full    Dental  (+) Dental Advisory Given, Chipped,    Pulmonary asthma , sleep apnea (not using BiPAP after weight loss) , former smoker   Pulmonary exam normal breath sounds clear to auscultation       Cardiovascular Normal cardiovascular exam+ dysrhythmias (eliquis ) Atrial Fibrillation  Rhythm:Regular Rate:Normal  TTE March 2015 Normal ejection fraction, 60 to 65%.  Normal structure and function.  Left atrium mildly dilated.  Loop recorder in place   Neuro/Psych negative neurological ROS  negative psych ROS   GI/Hepatic Neg liver ROS, hiatal hernia,GERD  ,,  Endo/Other  negative endocrine ROS    Renal/GU negative Renal ROS  negative genitourinary   Musculoskeletal  (+) Arthritis ,    Abdominal   Peds  Hematology negative hematology ROS (+)   Anesthesia Other Findings   Reproductive/Obstetrics                              Anesthesia Physical Anesthesia Plan  ASA: 3  Anesthesia Plan: General   Post-op Pain Management: Minimal or no pain anticipated   Induction: Intravenous  PONV Risk Score and Plan: 2 and Midazolam, Dexamethasone and Ondansetron  Airway Management Planned: Oral ETT  Additional Equipment:   Intra-op Plan:   Post-operative Plan: Extubation in OR  Informed Consent: I have reviewed the patients History and Physical, chart, labs and discussed the procedure including the risks, benefits and alternatives for the proposed anesthesia with the patient or authorized representative who has indicated his/her understanding and acceptance.     Dental advisory given  Plan Discussed with: CRNA  Anesthesia Plan Comments:           Anesthesia Quick Evaluation

## 2024-02-27 ENCOUNTER — Telehealth (HOSPITAL_COMMUNITY): Payer: Self-pay

## 2024-02-27 ENCOUNTER — Encounter (HOSPITAL_COMMUNITY): Payer: Self-pay | Admitting: Cardiovascular Disease

## 2024-02-27 MED FILL — Fentanyl Citrate Preservative Free (PF) Inj 100 MCG/2ML: INTRAMUSCULAR | Qty: 2 | Status: AC

## 2024-02-27 MED FILL — Atropine Sulfate Soln Prefill Syr 1 MG/10ML (0.1 MG/ML): INTRAMUSCULAR | Qty: 10 | Status: AC

## 2024-02-27 NOTE — Telephone Encounter (Signed)
 Spoke with patient to complete post procedure follow up call.  Patient reports no complications with groin sites.   Instructions reviewed with patient:  Remove large bandage at puncture site after 24 hours. It is normal to have bruising, tenderness, mild swelling, and a pea or marble sized lump/knot at the groin site which can take up to three months to resolve.  Get help right away if you notice sudden swelling at the puncture site.  Check your puncture site every day for signs of infection: fever, redness, swelling, pus drainage, warmth, foul odor or excessive pain. If this occurs, please call [phone], to speak with the RN Navigator. Get help right away if your puncture site is bleeding and the bleeding does not stop after applying firm pressure to the area.  You may continue to have skipped beats/ atrial fibrillation during the first several months after your procedure.  It is very important not to miss any doses of your blood thinner Eliquis .    You will follow up with the Afib clinic 4 weeks after your procedure and follow up with the APP 3 months after your procedure.  Activity restrictions reviewed.  Patient verbalized understanding to all instructions provided.

## 2024-03-12 ENCOUNTER — Telehealth (HOSPITAL_COMMUNITY): Payer: Self-pay | Admitting: *Deleted

## 2024-03-12 NOTE — Telephone Encounter (Signed)
 Pt called and reported concerns with left side ablation site.Pt describes site being swollen and red sudden on site since yesterday. Pt denies any  activity such as heavy lifting that may caused it. He also complains of some pains to chest and back. Discussed with J.Suarez and plan is for appt tomorrow. Pt agrees and appt made.

## 2024-03-13 ENCOUNTER — Encounter (HOSPITAL_COMMUNITY): Payer: Self-pay

## 2024-03-13 ENCOUNTER — Ambulatory Visit (HOSPITAL_COMMUNITY): Admission: RE | Admit: 2024-03-13 | Discharge: 2024-03-13 | Attending: Internal Medicine | Admitting: Internal Medicine

## 2024-03-13 DIAGNOSIS — I480 Paroxysmal atrial fibrillation: Secondary | ICD-10-CM

## 2024-03-13 MED ORDER — MUPIROCIN 2 % EX OINT: 1.0000 | TOPICAL_OINTMENT | Freq: Two times a day (BID) | CUTANEOUS | 0 refills | Status: DC

## 2024-03-13 MED ORDER — SULFAMETHOXAZOLE-TRIMETHOPRIM 800-160 MG PO TABS: 1.0000 | ORAL_TABLET | Freq: Two times a day (BID) | ORAL | 0 refills | Status: DC

## 2024-03-13 NOTE — Patient Instructions (Addendum)
 mupirocin ointment (BACTROBAN) 2 % two times a day to affected area  sulfamethoxazole-trimethoprim (BACTRIM DS) two times a day for 7 days

## 2024-03-13 NOTE — Progress Notes (Signed)
 Patient is status post A-fib ablation by Dr. Gustabo on 02/26/2024.  Patient is here for groin site check.  He contacted the office yesterday noting new swelling and redness at left groin incision site.  He denies activities such as heavy lifting.  He notes that he may have scrub the area during shower yesterday harder than normal.  He denies any recent fever.  No missed doses of Eliquis  5 mg twice daily.  Left groin incision site appears mildly erythematous without induration and there is a small marble sized knot at left groin site.  I was able to express some pus from a 3 to 4 mm opening.  Patient noted it was a little tender.  After discussion with patient, I will elect to begin antibiotic coverage.  Begin Bactrim  DS 1 tablet p.o. twice daily x 7 days.  Will also prescribe mupirocin  to apply topically to affected area twice daily.  Follow-up with A-fib clinic in 2 weeks as scheduled.

## 2024-03-21 ENCOUNTER — Ambulatory Visit: Attending: Cardiovascular Disease

## 2024-03-21 DIAGNOSIS — I480 Paroxysmal atrial fibrillation: Secondary | ICD-10-CM

## 2024-03-22 LAB — CUP PACEART REMOTE DEVICE CHECK
Date Time Interrogation Session: 20251211231147
Implantable Pulse Generator Implant Date: 20230406

## 2024-03-25 ENCOUNTER — Ambulatory Visit (HOSPITAL_COMMUNITY)
Admission: RE | Admit: 2024-03-25 | Discharge: 2024-03-25 | Disposition: A | Source: Ambulatory Visit | Attending: Internal Medicine | Admitting: Internal Medicine

## 2024-03-25 VITALS — BP 122/72 | HR 57 | Ht 70.0 in | Wt 191.8 lb

## 2024-03-25 DIAGNOSIS — I480 Paroxysmal atrial fibrillation: Secondary | ICD-10-CM

## 2024-03-25 DIAGNOSIS — I4891 Unspecified atrial fibrillation: Secondary | ICD-10-CM

## 2024-03-25 NOTE — Progress Notes (Signed)
 Primary Care Physician: [name], MD Primary Cardiologist: None Electrophysiologist: Eulas FORBES Furbish, MD     Referring Physician: Dr. Furbish Locus BRANDO TAVES is a 58 y.o. male with a history of OSA on BiPAP and persistent atrial fibrillation who presents for consultation in the [facility]. Patient is on Eliquis  for stroke prevention.  On evaluation today, patient is currently in NSR. S/p Afib ablation on 02/26/24 by Dr. Furbish. No episodes of Afib since ablation. No chest pain or SOB. Patient seen for incision check on 12/4 and started on antibiotic for concern of purulent drainage. His incision sites have healed subsequently without issue. No missed doses of anticoagulant.  Today, he denies symptoms of orthopnea, PND, lower extremity edema, dizziness, presyncope, syncope, snoring, daytime somnolence, bleeding, or neurologic sequela. The patient is tolerating medications without difficulties and is otherwise without complaint today.    he has a BMI of Body mass index is 27.52 kg/m.SABRA Filed Weights   03/25/24 1108  Weight: 87 kg    Current Outpatient Medications  Medication Sig Dispense Refill   apixaban  (ELIQUIS ) 5 MG TABS tablet Take 1 tablet (5 mg total) by mouth 2 (two) times daily. 60 tablet 11   cetirizine (ZYRTEC) 10 MG tablet Take 10 mg by mouth daily.     Menaquinone-7 (VITAMIN K2 PO) Take 200 mcg by mouth daily.     Multiple Vitamin (MULTIVITAMIN ADULT PO) Take 1 tablet by mouth daily. Centrum men     omeprazole  (PRILOSEC) 40 MG capsule TAKE 1 CAPSULE BY MOUTH EVERY DAY (Patient taking differently: Take 20 mg by mouth daily.) 90 capsule 0   Probiotic Product (PROBIOTIC PO) Take 1 capsule by mouth daily.     tirzepatide  (ZEPBOUND ) 2.5 MG/0.5ML injection vial INJECT 0.5 ML (2.5 MG) UNDER THE SKIN ONCE WEEKLY (0.5ML= 50 UNITS) 6 mL 1   MAGNESIUM GLYCINATE PO Take 200 mg by mouth daily. (Patient not taking: Reported on 03/25/2024)      No current facility-administered medications for this encounter.    Atrial Fibrillation Management history:  Previous antiarrhythmic drugs: Multaq  Previous cardioversions: multiple  Previous ablations: 2018/2019, 02/26/24  Anticoagulation history: Eliquis    ROS- All systems are reviewed and negative except as per the HPI above.  Physical Exam: BP 122/72   Pulse (!) 57   Ht 5' 10 (1.778 m)   Wt 87 kg   BMI 27.52 kg/m   GEN: Well nourished, well developed in no acute distress NECK: No JVD; No carotid bruits CARDIAC: Regular rate and rhythm, no murmurs, rubs, gallops RESPIRATORY:  Clear to auscultation without rales, wheezing or rhonchi  ABDOMEN: Soft, non-tender, non-distended EXTREMITIES:  No edema; No deformity   EKG today demonstrates  EKG Interpretation Date/Time:  Tuesday March 25 2024 11:13:05 EST Ventricular Rate:  57 PR Interval:  164 QRS Duration:  80 QT Interval:  402 QTC Calculation: 391 R Axis:   82  Text Interpretation: Sinus bradycardia Otherwise normal ECG When compared with ECG of 26-Feb-2024 09:34, No significant change was found Confirmed by Terra Pac ([id number]) on 03/25/2024 11:16:19 AM     ASSESSMENT & PLAN CHA2DS2-VASc Score = 0  The patient's score is based upon: CHF History: 0 HTN History: 0 Diabetes History: 0 Stroke History: 0 Vascular Disease History: 0 Age Score: 0 Gender Score: 0       ASSESSMENT AND PLAN: Paroxysmal Atrial Fibrillation (ICD10:  I48.0) The patient's CHA2DS2-VASc score is 0, indicating a 0.2%  annual risk of stroke.   S/p Afib ablation on 02/26/24 by Dr. Nancey.  Patient is currently in NSR.  We discussed the to expect during the recovery period following A-fib ablation.  Okay to resume exercise.  Continue Eliquis  5 mg BID without interruption in the blanking period. He will discontinue medication at upcoming office visit due to risk score of zero.      Follow up with EP as scheduled.    Terra Pac, [facility]  Afib Clinic [address] [place], [place] [address] [phone]

## 2024-03-28 NOTE — Progress Notes (Signed)
 Remote Loop Recorder Transmission

## 2024-04-07 ENCOUNTER — Ambulatory Visit: Payer: Self-pay | Admitting: Cardiovascular Disease

## 2024-04-17 ENCOUNTER — Encounter: Payer: Self-pay | Admitting: Family Medicine

## 2024-04-21 ENCOUNTER — Ambulatory Visit: Payer: Self-pay | Attending: Cardiovascular Disease

## 2024-04-21 DIAGNOSIS — I4819 Other persistent atrial fibrillation: Secondary | ICD-10-CM

## 2024-04-21 LAB — CUP PACEART REMOTE DEVICE CHECK
Date Time Interrogation Session: 20260111231247
Implantable Pulse Generator Implant Date: 20230406

## 2024-04-22 ENCOUNTER — Encounter: Payer: Self-pay | Admitting: Family Medicine

## 2024-04-22 ENCOUNTER — Ambulatory Visit: Payer: Self-pay | Admitting: Family Medicine

## 2024-04-22 VITALS — BP 116/70 | HR 70 | Ht 70.0 in | Wt 195.6 lb

## 2024-04-22 DIAGNOSIS — E66811 Obesity, class 1: Secondary | ICD-10-CM | POA: Diagnosis not present

## 2024-04-22 MED ORDER — SEMAGLUTIDE-WEIGHT MANAGEMENT 0.25 MG/0.5ML ~~LOC~~ SOAJ: 0.2500 mg | SUBCUTANEOUS | 1 refills | Status: AC

## 2024-04-22 NOTE — Progress Notes (Signed)
" ° °  Subjective:    Patient ID: Alan Thompson, male    DOB: Aug 18, 1965, 59 y.o.   MRN: [id number]  Discussed the use of AI scribe software for clinical note transcription with the patient, who gave verbal consent to proceed.   He has been experiencing difficulty stabilizing on Zepbound  due to significant side effects such as nausea and inability to exercise, even at the lowest dose. Initially, he tried a higher dose after switching from Wegovy , which resulted in severe side effects, including inability to eat for four days. He then reduced to the minimum dose, which was still intolerable due to nausea and exercise intolerance.  He previously used Wegovy  successfully but had to switch due to insurance issues. He recalls using a dose of 1.7 mg, which was effective, and aims to titrate back up to a similar dose. He notes a weight gain of 30 pounds since stopping Wegovy , with a current weight of 195 pounds, and wants to stabilize at 170 pounds.  He has a history of severe sleep apnea requiring a BiPAP machine when he weighed 260 pounds, which resolved after weight loss on Wegovy . He no longer requires the machine and reports improved comfort when lying on his stomach, which previously exacerbated his apnea.  He also notes a significant reduction in alcohol consumption since starting GLP-1 receptor agonists, stating that his 'appetite for alcohol pretty much completely disappeared.'           Review of Systems     Objective:    Physical Exam MEASUREMENTS: Weight- 195.            Assessment & Plan:  Obesity, class 1 Weight gain to 195 lbs. Previous success with Wegovy ; Zepbound  not tolerated. Plan to restart Wegovy  with gradual dose escalation. Discussed potential benefits and lack of long-term data on GLP-1 receptor agonists. - Switched to Wegovy , starting at 0.25 mg. - Instructed gradual dose titration. - Scheduled follow-up in 3-4 months for weight assessment and treatment adjustment.    He will leave a message on MyChart in 1 month and I will see him again in about 3 to 4 months.  Slowly increase his dosing based on symptoms.    "

## 2024-04-25 ENCOUNTER — Ambulatory Visit: Payer: Self-pay | Admitting: Cardiovascular Disease

## 2024-04-25 NOTE — Progress Notes (Signed)
 Remote Loop Recorder Transmission

## 2024-05-22 ENCOUNTER — Ambulatory Visit: Payer: Self-pay

## 2024-05-27 ENCOUNTER — Ambulatory Visit: Payer: Self-pay | Admitting: Pulmonary Disease

## 2024-06-02 ENCOUNTER — Encounter: Admitting: Family Medicine

## 2024-06-10 ENCOUNTER — Encounter: Admitting: Family Medicine

## 2024-06-22 ENCOUNTER — Ambulatory Visit: Payer: Self-pay

## 2024-07-23 ENCOUNTER — Ambulatory Visit: Payer: Self-pay

## 2024-08-21 ENCOUNTER — Ambulatory Visit: Payer: Self-pay | Admitting: Family Medicine

## 2024-08-23 ENCOUNTER — Ambulatory Visit: Payer: Self-pay
